# Patient Record
Sex: Female | Born: 1950 | Race: Black or African American | Hispanic: No | Marital: Single | Smoking: Current every day smoker
Health system: Southern US, Community
[De-identification: ages and names within clinical notes are randomized; demographics above are authoritative.]

## PROBLEM LIST (undated history)

## (undated) DIAGNOSIS — I10 Essential (primary) hypertension: Secondary | ICD-10-CM

## (undated) DIAGNOSIS — R609 Edema, unspecified: Secondary | ICD-10-CM

## (undated) DIAGNOSIS — G610 Guillain-Barre syndrome: Secondary | ICD-10-CM

## (undated) HISTORY — PX: ABDOMINAL HYSTERECTOMY: SHX81

## (undated) HISTORY — PX: OTHER PROCEDURE: U1053

---

## 2011-07-28 ENCOUNTER — Encounter (HOSPITAL_COMMUNITY): Payer: Self-pay

## 2011-07-28 ENCOUNTER — Emergency Department
Admit: 2011-07-28 | Discharge: 2011-07-28 | Disposition: A | Discharge status: Home Health | Payer: Self-pay | Attending: Emergency Medicine | Admitting: Emergency Medicine

## 2011-07-28 HISTORY — DX: Essential (primary) hypertension: I10

## 2011-07-28 HISTORY — DX: Guillain-Barre syndrome (CMS-HCC): G61.0

## 2011-07-28 MED ORDER — HYDROCHLOROTHIAZIDE 25 MG OR TABS
25.00 mg | ORAL_TABLET | Freq: Every day | ORAL | Status: AC
Start: 2011-07-28 — End: ?

## 2011-07-28 MED ORDER — HYDROCHLOROTHIAZIDE 25 MG OR TABS
25.0000 mg | ORAL_TABLET | Freq: Every day | ORAL | Status: DC
Start: 2011-07-28 — End: 2011-07-28

## 2011-07-28 MED ORDER — HYDROCHLOROTHIAZIDE 25 MG OR TABS
25.00 mg | ORAL_TABLET | Freq: Once | ORAL | Status: AC
Start: 2011-07-28 — End: 2011-07-28
  Administered 2011-07-28: 25 mg via ORAL
  Filled 2011-07-28: qty 1

## 2011-07-28 NOTE — ED Notes (Signed)
D/c instrucitons given with pt teaching of dx and medications. Verbal understanding. VSS. NAD. No CP or SOB. TED hose applied by pt.

## 2011-07-28 NOTE — ED Attending Note (Signed)
Patient's history, physical, and plan of care reviewed with resident (Dr. 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    Denies fevers, chills, LE redness, calf pain, SOB, or chest pain.      ROS: As per HPI.  All other systems reviewed and negative.    PMHx: HTN (untreated); Remote h/o Guillan-Barre  PSHx: C-section; Tracheostomy s/p reversed; Hysterectomy  SHx: Denies cigarette smoking, EtOH abuse, or illicit drug use (remote meth use)  FAMHx: Denies family history of diabetes, HTN, stroke, heart problems.  MEDS: Zoloft, Ambien  ALL: NKDA    PE  Initial ED Vitals:  T=97.9; BP=163/109; HR=83; RR=16; Sat=100% room air (normal)  GEN: NAD; A&Ox3  HEENT: perrl; eomi  NECK: no JVD; supple  CV: RRR; S1+S2 nl  LUNGS: CTAB  ABD: soft; NT/ND; +BS  EXT: trace pitting edema to legs to ankles; +darkened discoloration around skin of ankles c/w venous stasis; 2+ bilateral, equal distal pulses in all limbs  NEURO: moving all extremities at full strength; no dysmetria; normal gait    Pertinent Test Results:  Bedside ultrasound performed to evaluate for DVT:  Probe: Linear Probe  Indication: lower extremity swelling  Findings:  Patient placed in supine position, with the R-hip externally rotated and flexed.  Patient appropriately draped and the linear high-frequency probe was used.      The R-Femoral vein was visualized at the junction of the R-saphenous vein takeoff.  The vein was examined from 2cm proximal to this point & distal to this point until the common-femoral vein divides into the superficial + deep femoral veins.  Compression ultrasound at this point was negative for DVT (vein fully compressible).      The L-Femoral vein was visualized at the junction of the L-saphenous  vein takeoff.  The vein was examined from 2cm proximal to this point & distal to this point until the common-femoral vein divides into the superficial + deep femoral veins.  Compression ultrasound at this point was negative for DVT (vein fully compressible).      The R-Popliteal Vein was then visualized by placing the probe on the popliteal fossa on the posteromedial aspect of the knee. The distal 2cm of the vein was visualized and scanned until its divisions into the anterior tibial vein / posterior tibial vein / peroneal vein.  Compression ultrasound at this point was negative for DVT (vein fully compressible).      The L-Popliteal Vein was then visualized by placing the probe on the popliteal fossa on the posteromedial aspect of the knee. The distal 2cm of the vein was visualized and scanned until its divisions into the anterior tibial vein / posterior tibial vein / peroneal vein.  Compression ultrasound at this point was negative for DVT (vein fully compressible).        ED Course / Medical Decision Making:  Overall this is a 66M with h/o HTN, prior Guillan-Barre (remote) now with bilateral LE swelling and darkened skin c/w venous stasis.  HTN is also untreated and pt not on meds.  Denies HA, blurred vision, chest pain, SOB (no signs of end-organ deficits).  Concern for DVT is very low given symmetric findings c/w venous stasis and lack of leg pain/tenderness.  Bedside US is negative for  DVT.  Patient given lower extremity compression stockings and discharged from ED with RX for HCTZ.  Will have pt f/u in outpatient clinic.  Return precautions given..    Case d/w Dr. Jacinta Shoe.

## 2011-07-28 NOTE — ED Provider Notes (Signed)
History  Chief Complaint   Patient presents with   . Foot Pain     pt co skin turning dark on lle c some swelling at times, has been using epison salt to help it but it has not working     HPI Comments: 61 yo with remote hx of Guillan barre, untreated HTN here with had bilateral foot swelling x 3-4 days. Also noticed darkening of her skin. Has never happened to her before. Started off as pruritic, still intermittently. Intermittently painful to touch with worst pain 6-7/10. Describes pain as "charlie horse" Lives at [facility]. Denies changes in detergent, sheets, no new lotions. No unusual contacts she has noticed. No one else with skin issues at [facility].  No history of LE edema in past. Do hx of DOE. Sleeps flat- no orthopnea. No PND. NO hx of heart failure. No recent long distance travel. Smokes 1pp/week. No other drugs. Denies ETOH.     The history is provided by the patient. No language interpreter was used.       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 Negative for fever, chills, diaphoresis and unexpected weight change.   Respiratory: Negative for cough, chest tightness, shortness of breath and wheezing.    Cardiovascular: Positive for leg swelling. Negative for chest pain and palpitations.   Gastrointestinal: Negative for nausea, vomiting, abdominal pain, diarrhea, constipation, blood in stool and abdominal distention.   Genitourinary: Negative for dysuria, hematuria, decreased urine volume and difficulty urinating.   Musculoskeletal: Negative for back pain, joint swelling and arthralgias.   Skin: Positive for color change and rash.   Neurological: Positive for numbness. Negative for weakness and headaches.       Physical Exam  BP  163/109  Pulse 83  Temp(Src) 97.9 F (36.6 C)  Resp 16  Wt 58.968 kg (130 lb)  SpO2 100%    Physical Exam   Constitutional: She is oriented to person, place, and time. She appears well-developed and well-nourished. No distress.   HENT:   Head: Normocephalic and atraumatic.   Mouth/Throat: Oropharynx is clear and moist.   Poor dentition   Eyes: Conjunctivae and EOM are normal. Right eye exhibits no discharge. Left eye exhibits no discharge.   Neck: Normal range of motion. Neck supple.   Cardiovascular: Normal rate and regular rhythm.  Exam reveals no gallop and no friction rub.    Murmur heard.  Pulmonary/Chest: Effort normal and breath sounds normal. No respiratory distress. She has no wheezes. She has no rales. She exhibits no tenderness.   Abdominal: Soft. She exhibits no distension. There is no tenderness. There is no guarding.   Musculoskeletal:   +1 bilateral pedal edema R>L. ttp over R meliolus with no calf tenderness or palpable cords. No erythema or open wounds, warmth. Darkening of skin around bilateral ankles c/w chronic venous stasis changes. Onycomychosis bilateral feet. Bedside ultrasound negative for DVT bilaterally.    Neurological: She is alert and oriented to person, place, and time. Coordination normal.   Skin: Skin is warm and dry. Rash noted. She is not diaphoretic.   Scattered macular rash on shins  ED Course/Medical Decision Making Narrative  61 yo untreated htn w LE swelling and skin changes likely 2/2 to chronic venous stasis. No dvt on bedside u/s. No e/o of CHF on exam. No e/o cellulitis  - HCTZ 25mg  for HTN, instructed to see PCP 1-2 weeks will likely need additional agent  - compression stockings  - discharge home        Additional Notes      Grant Fontana, MD  Resident  07/28/11 [phone]

## 2011-07-28 NOTE — ED Notes (Signed)
Pt c/o bilateral lower leg "spots" no edema, + CSM intact.

## 2011-07-28 NOTE — Discharge Instructions (Addendum)
Hypertension    You have been diagnosed with elevated blood pressure.    The medical term for high blood pressure is hypertension. Many people feel anxious or uncomfortable about being at the hospital. If you feel anxious today, this could make your blood pressure appear high, even if your blood pressure is usually normal. Check your blood pressure several more times when you are not feeling stress. Keep a record of these readings and give this information to your regular doctor. He or she will decide whether you have hypertension that requires medical treatment.    If your blood pressure becomes extremely high all of a sudden, you will probably notice symptoms. In fact, very high blood pressure is a medical emergency. Most people with hypertension have blood pressure that is only a little too high. Mild high blood pressure does not cause specific symptoms. Instead, the effects of hypertension develop slowly over time. Untreated hypertension can affect the heart, brain, kidneys, eyes, and blood vessels. Unfortunately, by the time side-effects become noticeable, the body has already been damaged. This is why hypertension is called "the silent killer!"    The physician treating you today decided to start you on a blood pressure medicine. It is VERY IMPORTANT that you follow up with your regular doctor so he or she can follow your progress and adjust the blood pressure medications. It is likely you will need another medication for your blood pressure so please see your primary care doctor for more help.     It is important to follow up with your regular doctor. Check your blood pressure several times in the next 1 to 2 weeks and tell your doctor about the results. It may be helpful to keep a log or a journal where you can write down your blood pressures. Note the time of day and the activity you were doing when the reading was taken.    YOU SHOULD SEEK MEDICAL ATTENTION IMMEDIATELY, EITHER HERE OR AT THE NEAREST  EMERGENCY DEPARTMENT, IF ANY OF THE FOLLOWING OCCURS:   You have a headache.   You have chest pain.   You are short of breath or have trouble breathing.    You feel weak, especially on only one side of the body.   Your symptoms get worse or you have other concerns.      You have also been diagnosed with chronic venous stasis (leaky veins in your legs). We are giving you compression stockings to wear and this should help with the swelling. The skin discoloration may remain.

## 2011-07-31 NOTE — ED Follow-up Note (Signed)
Follow-up type: Callback       Routine ED Patient Call Back    Patient unable to be contacted, no message left  Number provided not in service

## 2013-09-24 ENCOUNTER — Encounter (HOSPITAL_COMMUNITY): Payer: Self-pay | Admitting: Emergency Medicine

## 2013-09-24 ENCOUNTER — Emergency Department (HOSPITAL_COMMUNITY): Payer: Self-pay

## 2013-09-24 ENCOUNTER — Inpatient Hospital Stay (HOSPITAL_COMMUNITY)
Admission: EM | Admit: 2013-09-24 | Discharge: 2013-09-27 | DRG: 917 | Disposition: A | Discharge status: Home / Self Care | Payer: Self-pay | Attending: Internal Medicine | Admitting: Internal Medicine

## 2013-09-24 DIAGNOSIS — R911 Solitary pulmonary nodule: Secondary | ICD-10-CM

## 2013-09-24 DIAGNOSIS — I10 Essential (primary) hypertension: Secondary | ICD-10-CM | POA: Diagnosis present

## 2013-09-24 DIAGNOSIS — I214 Non-ST elevation (NSTEMI) myocardial infarction: Secondary | ICD-10-CM

## 2013-09-24 DIAGNOSIS — R072 Precordial pain: Secondary | ICD-10-CM

## 2013-09-24 DIAGNOSIS — T43591A Poisoning by other antipsychotics and neuroleptics, accidental (unintentional), initial encounter: Secondary | ICD-10-CM | POA: Diagnosis present

## 2013-09-24 DIAGNOSIS — F15122 Other stimulant abuse with intoxication with perceptual disturbance: Secondary | ICD-10-CM

## 2013-09-24 DIAGNOSIS — F17200 Nicotine dependence, unspecified, uncomplicated: Secondary | ICD-10-CM | POA: Diagnosis present

## 2013-09-24 DIAGNOSIS — T43624A Poisoning by amphetamines, undetermined, initial encounter: Principal | ICD-10-CM | POA: Diagnosis present

## 2013-09-24 DIAGNOSIS — F15929 Other stimulant use, unspecified with intoxication, unspecified: Secondary | ICD-10-CM | POA: Diagnosis present

## 2013-09-24 DIAGNOSIS — R011 Cardiac murmur, unspecified: Secondary | ICD-10-CM | POA: Diagnosis present

## 2013-09-24 DIAGNOSIS — Z79899 Other long term (current) drug therapy: Secondary | ICD-10-CM

## 2013-09-24 DIAGNOSIS — F1510 Other stimulant abuse, uncomplicated: Secondary | ICD-10-CM | POA: Diagnosis present

## 2013-09-24 DIAGNOSIS — R079 Chest pain, unspecified: Secondary | ICD-10-CM | POA: Diagnosis present

## 2013-09-24 DIAGNOSIS — Z7982 Long term (current) use of aspirin: Secondary | ICD-10-CM

## 2013-09-24 HISTORY — DX: Guillain-Barre syndrome: G61.0

## 2013-09-24 HISTORY — DX: Essential (primary) hypertension: I10

## 2013-09-24 HISTORY — DX: Edema, unspecified: R60.9

## 2013-09-24 LAB — RAPID URINE DRUG SCREEN, HOSP PERFORMED
Amphetamines: POSITIVE — AB
Barbiturates: NOT DETECTED
Benzodiazepines: NOT DETECTED
Cocaine: NOT DETECTED
Opiates: POSITIVE — AB
Tetrahydrocannabinol: NOT DETECTED

## 2013-09-24 LAB — I-STAT TROPONIN, ED
TROPONIN I, POC: 0.14 ng/mL — AB (ref 0.00–0.08)
Troponin i, poc: 0.06 ng/mL (ref 0.00–0.08)

## 2013-09-24 LAB — CBC WITH DIFFERENTIAL/PLATELET
Basophils Absolute: 0 10*3/uL (ref 0.0–0.1)
Basophils Relative: 0 % (ref 0–1)
Eosinophils Absolute: 0.1 10*3/uL (ref 0.0–0.7)
Eosinophils Relative: 1 % (ref 0–5)
HCT: 43.5 % (ref 36.0–46.0)
Hemoglobin: 14.4 g/dL (ref 12.0–15.0)
Lymphocytes Relative: 19 % (ref 12–46)
Lymphs Abs: 1.4 10*3/uL (ref 0.7–4.0)
MCH: 29 pg (ref 26.0–34.0)
MCHC: 33.1 g/dL (ref 30.0–36.0)
MCV: 87.5 fL (ref 78.0–100.0)
Monocytes Absolute: 0.5 10*3/uL (ref 0.1–1.0)
Monocytes Relative: 6 % (ref 3–12)
Neutro Abs: 5.3 10*3/uL (ref 1.7–7.7)
Neutrophils Relative %: 74 % (ref 43–77)
Platelets: 217 10*3/uL (ref 150–400)
RBC: 4.97 MIL/uL (ref 3.87–5.11)
RDW: 13.5 % (ref 11.5–15.5)
WBC: 7.2 10*3/uL (ref 4.0–10.5)

## 2013-09-24 LAB — BASIC METABOLIC PANEL
Anion gap: 17 — ABNORMAL HIGH (ref 5–15)
BUN: 14 mg/dL (ref 6–23)
CO2: 22 mEq/L (ref 19–32)
Calcium: 9.1 mg/dL (ref 8.4–10.5)
Chloride: 104 mEq/L (ref 96–112)
Creatinine, Ser: 0.78 mg/dL (ref 0.50–1.10)
GFR calc Af Amer: >90 mL/min (ref >90)
GFR calc non Af Amer: 87 mL/min — ABNORMAL LOW (ref >90)
Glucose, Bld: 118 mg/dL — ABNORMAL HIGH (ref 70–99)
Potassium: 3.6 mEq/L — ABNORMAL LOW (ref 3.7–5.3)
Sodium: 143 mEq/L (ref 137–147)

## 2013-09-24 LAB — TROPONIN I: Troponin I: 0.35 ng/mL (ref <0.30)

## 2013-09-24 LAB — D-DIMER, QUANTITATIVE: D-Dimer, Quant: 1.25 ug/mL-FEU — ABNORMAL HIGH (ref 0.00–0.48)

## 2013-09-24 MED ORDER — ASPIRIN 81 MG PO CHEW
324.0000 mg | CHEWABLE_TABLET | Freq: Once | ORAL | Status: AC
  Administered 2013-09-24: 324 mg via ORAL
  Filled 2013-09-24: qty 4

## 2013-09-24 MED ORDER — IOHEXOL 350 MG/ML SOLN
100.0000 mL | Freq: Once | INTRAVENOUS | Status: AC | PRN
  Administered 2013-09-24: 100 mL via INTRAVENOUS

## 2013-09-24 MED ORDER — MORPHINE SULFATE 4 MG/ML IJ SOLN
4.0000 mg | Freq: Once | INTRAMUSCULAR | Status: AC
  Administered 2013-09-24: 4 mg via INTRAVENOUS
  Filled 2013-09-24: qty 1

## 2013-09-24 NOTE — ED Provider Notes (Signed)
CSN: [id number]     Arrival date & time 09/24/13  1657 History   First MD Initiated Contact with Patient 09/24/13 1658     Chief Complaint  Patient presents with  . Headache     (Consider location/radiation/quality/duration/timing/severity/associated sxs/prior Treatment) HPI Comments: Patient with history of Guillain-Barr, and hypertension, and peripheral edema presents emergency department with chief complaint of a headache that started around noon today. She complains of associated facial numbness, no weakness. She denies any weakness in her extremities. She does not have a history of headaches. She denies any fevers, or chills. She states the headache has progressively worsened since onset.  Additionally, she complains of chest pain and shortness of breath. She's been having chest pain for the past couple of days. She denies any cough. She has a history of hypertension, but no known cardiac disease.  She also complains of a bug bites to her lower extremities, as well as some erythema to her shins.  The history is provided by the patient. No language interpreter was used.    No past medical history on file. No past surgical history on file. No family history on file. History  Substance Use Topics  . Smoking status: Not on file  . Smokeless tobacco: Not on file  . Alcohol Use: Not on file   OB History   No data available     Review of Systems  Constitutional: Negative for fever and chills.  Respiratory: Positive for shortness of breath.   Cardiovascular: Positive for chest pain.  Skin: Positive for rash.  Neurological: Positive for numbness and headaches.  All other systems reviewed and are negative.     Allergies  Review of patient's allergies indicates not on file.  Home Medications   Prior to Admission medications   Not on File   There were no vitals taken for this visit. Physical Exam  Nursing note and vitals reviewed. Constitutional: She is oriented to  person, place, and time. She appears well-developed and well-nourished.  HENT:  Head: Normocephalic and atraumatic.  Poor dentition throughout  Eyes: Conjunctivae and EOM are normal. Pupils are equal, round, and reactive to light.  Neck: Normal range of motion. Neck supple.  Cardiovascular: Normal rate and regular rhythm.  Exam reveals no gallop and no friction rub.   No murmur heard. Pulmonary/Chest: Effort normal and breath sounds normal. No respiratory distress. She has no wheezes. She has no rales. She exhibits no tenderness.  Abdominal: Soft. Bowel sounds are normal. She exhibits no distension and no mass. There is no tenderness. There is no rebound and no guarding.  Musculoskeletal: Normal range of motion. She exhibits no edema and no tenderness.  1+ edema in lower extremities, range of motion and strength in upper and lower extremities is 5/5  Neurological: She is alert and oriented to person, place, and time.  CN 3-12 intact, no pronator drift, normal finger to nose, speech is clear, movements are goal oriented  Skin: Skin is warm and dry.  Scattered bug bites on the lower extremities, shins are mildly erythematous, but this does not look like cellulitis, more characteristic of peripheral vascular disease  Psychiatric: She has a normal mood and affect. Her behavior is normal. Judgment and thought content normal.    ED Course  Procedures (including critical care time) Results for orders placed during the hospital encounter of 09/24/13  MRSA PCR SCREENING      Result Value Ref Range   MRSA by PCR NEGATIVE  NEGATIVE  CBC  WITH DIFFERENTIAL      Result Value Ref Range   WBC 7.2  4.0 - 10.5 K/uL   RBC 4.97  3.87 - 5.11 MIL/uL   Hemoglobin 14.4  12.0 - 15.0 g/dL   HCT 65.7  84.6 - 96.2 %   MCV 87.5  78.0 - 100.0 fL   MCH 29.0  26.0 - 34.0 pg   MCHC 33.1  30.0 - 36.0 g/dL   RDW 95.2  84.1 - 32.4 %   Platelets 217  150 - 400 K/uL   Neutrophils Relative % 74  43 - 77 %   Neutro Abs  5.3  1.7 - 7.7 K/uL   Lymphocytes Relative 19  12 - 46 %   Lymphs Abs 1.4  0.7 - 4.0 K/uL   Monocytes Relative 6  3 - 12 %   Monocytes Absolute 0.5  0.1 - 1.0 K/uL   Eosinophils Relative 1  0 - 5 %   Eosinophils Absolute 0.1  0.0 - 0.7 K/uL   Basophils Relative 0  0 - 1 %   Basophils Absolute 0.0  0.0 - 0.1 K/uL  BASIC METABOLIC PANEL      Result Value Ref Range   Sodium 143  137 - 147 mEq/L   Potassium 3.6 (*) 3.7 - 5.3 mEq/L   Chloride 104  96 - 112 mEq/L   CO2 22  19 - 32 mEq/L   Glucose, Bld 118 (*) 70 - 99 mg/dL   BUN 14  6 - 23 mg/dL   Creatinine, Ser 4.01  0.50 - 1.10 mg/dL   Calcium 9.1  8.4 - 02.7 mg/dL   GFR calc non Af Amer 87 (*) >90 mL/min   GFR calc Af Amer >90  >90 mL/min   Anion gap 17 (*) 5 - 15  D-DIMER, QUANTITATIVE      Result Value Ref Range   D-Dimer, Quant 1.25 (*) 0.00 - 0.48 ug/mL-FEU  TROPONIN I      Result Value Ref Range   Troponin I 0.35 (*) <0.30 ng/mL  URINE RAPID DRUG SCREEN (HOSP PERFORMED)      Result Value Ref Range   Opiates POSITIVE (*) NONE DETECTED   Cocaine NONE DETECTED  NONE DETECTED   Benzodiazepines NONE DETECTED  NONE DETECTED   Amphetamines POSITIVE (*) NONE DETECTED   Tetrahydrocannabinol NONE DETECTED  NONE DETECTED   Barbiturates NONE DETECTED  NONE DETECTED  HEPARIN LEVEL (UNFRACTIONATED)      Result Value Ref Range   Heparin Unfractionated <0.10 (*) 0.30 - 0.70 IU/mL  TROPONIN I      Result Value Ref Range   Troponin I <0.30  <0.30 ng/mL  TROPONIN I      Result Value Ref Range   Troponin I 0.33 (*) <0.30 ng/mL  TROPONIN I      Result Value Ref Range   Troponin I <0.30  <0.30 ng/mL  LIPID PANEL      Result Value Ref Range   Cholesterol 139  0 - 200 mg/dL   Triglycerides 66  <253 mg/dL   HDL 70  >66 mg/dL   Total CHOL/HDL Ratio 2.0     VLDL 13  0 - 40 mg/dL   LDL Cholesterol 56  0 - 99 mg/dL  TSH      Result Value Ref Range   TSH 0.656  0.350 - 4.500 uIU/mL  CBC      Result Value Ref Range   WBC 5.1  4.0 -  10.5  K/uL   RBC 4.30  3.87 - 5.11 MIL/uL   Hemoglobin 12.2  12.0 - 15.0 g/dL   HCT 16.1  09.6 - 04.5 %   MCV 87.4  78.0 - 100.0 fL   MCH 28.4  26.0 - 34.0 pg   MCHC 32.4  30.0 - 36.0 g/dL   RDW 40.9  81.1 - 91.4 %   Platelets 223  150 - 400 K/uL  I-STAT TROPOININ, ED      Result Value Ref Range   Troponin i, poc 0.06  0.00 - 0.08 ng/mL   Comment 3           I-STAT TROPOININ, ED      Result Value Ref Range   Troponin i, poc 0.14 (*) 0.00 - 0.08 ng/mL   Comment NOTIFIED PHYSICIAN     Comment 3            Dg Chest 2 View  09/24/2013   CLINICAL DATA:  chest pain  EXAM: CHEST - 2 VIEW  COMPARISON:  None available  FINDINGS: Lungs are clear. Heart size and mediastinal contours are within normal limits. No effusion.  No pneumothorax.  Atheromatous aorta. Visualized skeletal structures are unremarkable.  IMPRESSION: No acute cardiopulmonary disease.   Electronically Signed   By: Oley Balm M.D.   On: 09/24/2013 19:05   Ct Head Wo Contrast  09/24/2013   CLINICAL DATA:  Headache and facial numbness.  EXAM: CT HEAD WITHOUT CONTRAST  TECHNIQUE: Contiguous axial images were obtained from the base of the skull through the vertex without intravenous contrast.  COMPARISON:  None.  FINDINGS: The ventricles are normal in size and configuration. No extra-axial fluid collections are identified. The gray-white differentiation is normal. No CT findings for acute intracranial process such as hemorrhage or infarction. No mass lesions. The brainstem and cerebellum are grossly normal.  The bony structures are intact. The paranasal sinuses and mastoid air cells are clear. The globes are intact.  IMPRESSION: No acute intracranial findings or mass lesion.   Electronically Signed   By: Loralie Champagne M.D.   On: 09/24/2013 18:53   Ct Angio Chest Pe W/cm &/or Wo Cm  09/25/2013   EXAM: CT ANGIOGRAPHY CHEST WITH CONTRAST  TECHNIQUE: Multidetector CT imaging of the chest was performed using the standard protocol during  bolus administration of intravenous contrast. Multiplanar CT image reconstructions and MIPs were obtained to evaluate the vascular anatomy.  CONTRAST:  OMNIPAQUE IOHEXOL 350 MG/ML SOLN  COMPARISON:  Prior radiograph performed earlier on the same day.  FINDINGS: Thyroid gland is within normal limits.  No pathologically enlarged mediastinal, hilar, or axillary lymph nodes are identified.  Intrathoracic aorta is of normal caliber and appearance. Mild calcified atheromatous disease seen within the aortic arch. Great vessels within normal limits.  Heart size is normal.  No pericardial effusion.  The pulmonary arterial tree is well opacified. No focal filling defect to suggest acute pulmonary embolism. Re-formatted imaging confirms these findings. The main pulmonary artery is of normal caliber measuring 2.1 cm in maximal diameter.  No pulmonary edema or pleural effusion. Subsegmental atelectasis seen dependently within the bilateral lung bases. A more focal 9 mm nodular opacity seen within the peripheral right lung base (series 6, image 63). No other pulmonary nodules or masses.  Visualized portions of the upper abdomen are within normal limits.  No acute osseous abnormality. No worrisome lytic or blastic osseous lesions.  Review of the MIP images confirms the above findings.  IMPRESSION: 1.  No CT evidence of acute pulmonary embolism. 2. No other acute cardiopulmonary abnormality identified. 3. Bibasilar atelectasis. 4. 8 mm irregular nodular opacity within the right lung base, indeterminate. If the patient is at high risk for bronchogenic carcinoma, follow-up chest CT at 3-846months is recommended. If the patient is at low risk for bronchogenic carcinoma, follow-up chest CT at 6-12 months is recommended. This recommendation follows the consensus statement: Guidelines for Management of Small Pulmonary Nodules Detected on CT Scans: A Statement from the Fleischner Society as published in Radiology 2005; 237:395-400.    Electronically Signed   By: Rise MuBenjamin  McClintock M.D.   On: 09/25/2013 01:15     Imaging Review No results found.   EKG Interpretation   Date/Time:  Friday September 24 2013 23:25:16 EDT Ventricular Rate:  79 PR Interval:  157 QRS Duration: 75 QT Interval:  398 QTC Calculation: 456 R Axis:   28 Text Interpretation:  Sinus rhythm Minimal ST depression, lateral leads  Confirmed by Denton LankSTEINL  MD, Caryn BeeKEVIN ([id number]) on 09/24/2013 11:33:07 PM      MDM   Final diagnoses:  NSTEMI (non-ST elevated myocardial infarction)    Patient with headache that started today around noon.  CT is negative.  As this falls within the 6 hour window, I have very low [facility].  Patient's headache is improved in the ED.  She moves all her extremities and has a normal neuro exam.  Doubt stroke or TIA.  She does have some erythema around her shins, which I suspect is peripheral vascular disease, but cellulitis is not ruled out.  Patient also reports CP and SOB.  [place]  She traveled by bus.  I will order a d-dimer along with a delta troponin.  If these tests are negative, I suspect that the patient can go home with PCP follow-up.   8:59 PM Delta troponin (i-stat) increased to 0.14.  Discussed this with Dr. Denton LankSteinl, will order lab troponin.  Lab troponin is .35.  CT chest is still pending to evaluate for PE.  Discussed with Dr. Denton LankSteinl, plan is admission after CT.  Patient signed out to Dr. Wilkie AyeHorton, who will follow up on CT and arrange for admission accordingly.  Roxy Horsemanobert Jaylenn Altier, PA-C 09/25/13 [phone]

## 2013-09-24 NOTE — ED Notes (Signed)
Pt. Reports having a headache since noon, face is numb.  Pt. Also reports having chest pain, Pt. Also reports that her palms and bottom of her are red. Pt. Also has bites on  Both of her legs. Bilateral legs are red up to her mid-shins.  Rt. Leg is redness is greater that the lt. Leg.  Rt. Leg is warm to touch.  Bilateral edema +1 to both feet.  Pt  Is alert and oriented X3.  Skin is warm and dry.  Pt is in NAD

## 2013-09-25 ENCOUNTER — Encounter (HOSPITAL_COMMUNITY): Payer: Self-pay | Admitting: Internal Medicine

## 2013-09-25 DIAGNOSIS — R011 Cardiac murmur, unspecified: Secondary | ICD-10-CM

## 2013-09-25 DIAGNOSIS — F1510 Other stimulant abuse, uncomplicated: Secondary | ICD-10-CM

## 2013-09-25 DIAGNOSIS — I517 Cardiomegaly: Secondary | ICD-10-CM

## 2013-09-25 DIAGNOSIS — F15929 Other stimulant use, unspecified with intoxication, unspecified: Secondary | ICD-10-CM | POA: Diagnosis present

## 2013-09-25 DIAGNOSIS — I214 Non-ST elevation (NSTEMI) myocardial infarction: Secondary | ICD-10-CM

## 2013-09-25 DIAGNOSIS — F19988 Other psychoactive substance use, unspecified with other psychoactive substance-induced disorder: Secondary | ICD-10-CM

## 2013-09-25 LAB — LIPID PANEL
Cholesterol: 139 mg/dL (ref 0–200)
HDL: 70 mg/dL (ref >39)
LDL Cholesterol: 56 mg/dL (ref 0–99)
Total CHOL/HDL Ratio: 2 RATIO
Triglycerides: 66 mg/dL (ref <150)
VLDL: 13 mg/dL (ref 0–40)

## 2013-09-25 LAB — CBC
HCT: 37.6 % (ref 36.0–46.0)
Hemoglobin: 12.2 g/dL (ref 12.0–15.0)
MCH: 28.4 pg (ref 26.0–34.0)
MCHC: 32.4 g/dL (ref 30.0–36.0)
MCV: 87.4 fL (ref 78.0–100.0)
PLATELETS: 223 10*3/uL (ref 150–400)
RBC: 4.3 MIL/uL (ref 3.87–5.11)
RDW: 13.6 % (ref 11.5–15.5)
WBC: 5.1 10*3/uL (ref 4.0–10.5)

## 2013-09-25 LAB — HEMOGLOBIN A1C
HEMOGLOBIN A1C: 5.6 % (ref <5.7)
Mean Plasma Glucose: 114 mg/dL (ref <117)

## 2013-09-25 LAB — TSH: TSH: 0.656 u[IU]/mL (ref 0.350–4.500)

## 2013-09-25 LAB — HEPARIN LEVEL (UNFRACTIONATED)
HEPARIN UNFRACTIONATED: 0.34 [IU]/mL (ref 0.30–0.70)
Heparin Unfractionated: <0.1 IU/mL — ABNORMAL LOW (ref 0.30–0.70)

## 2013-09-25 LAB — MRSA PCR SCREENING: MRSA by PCR: NEGATIVE

## 2013-09-25 LAB — TROPONIN I
TROPONIN I: 0.33 ng/mL — AB (ref <0.30)
Troponin I: <0.3 ng/mL (ref <0.30)

## 2013-09-25 MED ORDER — ASPIRIN EC 325 MG PO TBEC
325.0000 mg | DELAYED_RELEASE_TABLET | Freq: Every day | ORAL | Status: DC
  Filled 2013-09-25: qty 1

## 2013-09-25 MED ORDER — HEPARIN (PORCINE) IN NACL 100-0.45 UNIT/ML-% IJ SOLN
1200.0000 [IU]/h | INTRAMUSCULAR | Status: DC
  Administered 2013-09-25 (×2): 1100 [IU]/h via INTRAVENOUS
  Administered 2013-09-26 (×2): 1200 [IU]/h via INTRAVENOUS
  Filled 2013-09-25 (×5): qty 250

## 2013-09-25 MED ORDER — ATORVASTATIN CALCIUM 80 MG PO TABS
80.0000 mg | ORAL_TABLET | Freq: Every day | ORAL | Status: DC
  Administered 2013-09-25 – 2013-09-26 (×2): 80 mg via ORAL
  Filled 2013-09-25 (×3): qty 1

## 2013-09-25 MED ORDER — METOPROLOL TARTRATE 12.5 MG HALF TABLET
12.5000 mg | ORAL_TABLET | Freq: Two times a day (BID) | ORAL | Status: DC
  Administered 2013-09-25 – 2013-09-27 (×5): 12.5 mg via ORAL
  Filled 2013-09-25 (×6): qty 1

## 2013-09-25 MED ORDER — ASPIRIN 81 MG PO CHEW
81.0000 mg | CHEWABLE_TABLET | Freq: Every day | ORAL | Status: DC
  Administered 2013-09-25 – 2013-09-27 (×3): 81 mg via ORAL
  Filled 2013-09-25 (×3): qty 1

## 2013-09-25 MED ORDER — HEPARIN BOLUS VIA INFUSION
4000.0000 [IU] | Freq: Once | INTRAVENOUS | Status: AC
  Administered 2013-09-25: 4000 [IU] via INTRAVENOUS
  Filled 2013-09-25: qty 4000

## 2013-09-25 MED ORDER — HEPARIN (PORCINE) IN NACL 100-0.45 UNIT/ML-% IJ SOLN
900.0000 [IU]/h | INTRAMUSCULAR | Status: DC
  Administered 2013-09-25: 900 [IU]/h via INTRAVENOUS
  Filled 2013-09-25 (×2): qty 250

## 2013-09-25 MED ORDER — ACETAMINOPHEN 325 MG PO TABS: 650.0000 mg | ORAL_TABLET | ORAL | Status: DC | PRN

## 2013-09-25 MED ORDER — MORPHINE SULFATE 2 MG/ML IJ SOLN: 2.0000 mg | INTRAMUSCULAR | Status: DC | PRN

## 2013-09-25 MED ORDER — ONDANSETRON HCL 4 MG/2ML IJ SOLN: 4.0000 mg | Freq: Four times a day (QID) | INTRAMUSCULAR | Status: DC | PRN

## 2013-09-25 MED ORDER — HEPARIN BOLUS VIA INFUSION
2000.0000 [IU] | Freq: Once | INTRAVENOUS | Status: AC
  Administered 2013-09-25: 2000 [IU] via INTRAVENOUS
  Filled 2013-09-25: qty 2000

## 2013-09-25 MED ORDER — SODIUM CHLORIDE 0.9 % IV SOLN
INTRAVENOUS | Status: DC
  Administered 2013-09-25: 05:00:00 via INTRAVENOUS
  Administered 2013-09-26: 10 mL via INTRAVENOUS

## 2013-09-25 MED ORDER — SODIUM CHLORIDE 0.9 % IJ SOLN: 3.0000 mL | INTRAMUSCULAR | Status: DC | PRN

## 2013-09-25 MED ORDER — SODIUM CHLORIDE 0.9 % IJ SOLN
3.0000 mL | Freq: Two times a day (BID) | INTRAMUSCULAR | Status: DC
  Administered 2013-09-25 – 2013-09-26 (×3): 3 mL via INTRAVENOUS

## 2013-09-25 NOTE — Progress Notes (Signed)
CRITICAL VALUE ALERT  Critical value received:  0940  Date of notification:  07/[id number]  Time of notification: 0942  Critical value read back:yes   Nurse who received alert:  Marge K. Meryl CrutchLessard, RN   MD notified (1st page):  Dr. Marlin CanaryJessica Vann     Time of first page:  [phone]  MD notified (2nd page):  Time of second page:  Responding MD:  Dr. Marlin CanaryJessica Vann  Time MD responded:  [phone]

## 2013-09-25 NOTE — Progress Notes (Addendum)
ANTICOAGULATION CONSULT NOTE - Follow Up Consult  Pharmacy Consult for heparin Indication: chest pain/ACS  No Known Allergies  Patient Measurements: Height: 5\' 5"  (165.1 cm) Weight: 172 lb 13.5 oz (78.4 kg) IBW/kg (Calculated) : 57  Vital Signs: Temp: 98 F (36.7 C) (07/25 1200) Temp src: Oral (07/25 1200) BP: 128/59 mmHg (07/25 1200) Pulse Rate: 73 (07/25 0715)  Labs:  Recent Labs  09/24/13 1732  09/25/13 0545 09/25/13 0810 09/25/13 1225  HGB 14.4  --   --   --  12.2  HCT 43.5  --   --   --  37.6  PLT 217  --   --   --  223  HEPARINUNFRC  --   --   --   --  <0.10*  CREATININE 0.78  --   --   --   --   TROPONINI  --   < > <0.30 0.33* <0.30  < > = values in this interval not displayed.  Estimated Creatinine Clearance: 74.5 ml/min (by C-G formula based on Cr of 0.78).   Medications:  Scheduled:  . aspirin  81 mg Oral Daily  . atorvastatin  80 mg Oral q1800  . metoprolol tartrate  12.5 mg Oral BID  . sodium chloride  3 mL Intravenous Q12H   Infusions:  . sodium chloride 10 mL/hr at 09/25/13 0513  . heparin 900 Units/hr (09/25/13 0513)    Assessment: 63 yo female with NSTEMI on heparin at 900 units/hr and the initial heparin level is undetectable. Patient noted for cath on Monday. RN has noted episodes of Alaris pump alarms and possible IV occlusion.  Goal of Therapy:  Heparin level 0.3-0.7 units/ml Monitor platelets by anticoagulation protocol: Yes   Plan:  -Heparin bolus 2000 units IV then increase to 1100 units/hr  -Heparin level in 6 hours and daily wth CBC daily  Harland GermanAndrew Vara Mairena, Pharm D 09/25/2013 1:26 PM

## 2013-09-25 NOTE — H&P (Signed)
[facility] History and Physical  Tracie BeamCathlean Langhorst WUJ:[id number] DOB: [birth year] DOA: 09/24/2013  Referring physician: EDP PCP: No PCP Per Patient   Chief Complaint: Chest pain, numbness/tingling of face.   HPI: Tracie Torres is a 63 y.o. female h/o HTN, remote h/o heroin abuse, h/o GBS.  Presents to the ED with an episode of chest pain earlier in the afternoon.  Patient reports numbness, tingling, involving face, hands, feet this afternoon after which she experiened a shooting pain in her left chest with radiation to her back.  Pain was 8/10 and occurred while she was washing dishes.  Symptoms continued and she came to the ED as a result.  Pain resolved with morphine.  Total duration of chest pain was about 20 mins she states.  Did have SOB and lightheadedness with this.  Leading up to yesterday she was asymptomatic.  [place]  In the ED POC trop was 0.14, repeat lab troponin was 0.35.  D-Dimer was elevated CTA chest was performed and negative.  UDS positive for amphetamines.  When discussed with patient she claims no knowledge of the amphetamines, but does admit to buying "4 hand rolled cigarettes from someone because the store was closed on Thursday."  Review of Systems: Systems reviewed.  As above, otherwise negative  Past Medical History  Diagnosis Date  . Guillain Barr syndrome   . Hypertension   . Edema    Past Surgical History  Procedure Laterality Date  . Abdominal hysterectomy    . Cesarean section     Social History:  reports that she has been smoking Cigarettes.  She has been smoking about 0.25 packs per day. She has never used smokeless tobacco. She reports that she drinks about .6 ounces of alcohol per week. She reports that she does not use illicit drugs.  No Known Allergies  Family History  Problem Relation Age of Onset  . Other Mother     hit by car  . Other Brother     gun shot     Prior to Admission  medications   Medication Sig Start Date End Date Taking? Authorizing Provider  aspirin EC 81 MG tablet Take 81 mg by mouth daily.   Yes Historical Provider, MD  furosemide (LASIX) 20 MG tablet Take 20 mg by mouth daily.   Yes Historical Provider, MD  potassium chloride (K-DUR) 10 MEQ tablet Take 10 mEq by mouth daily.   Yes Historical Provider, MD  triamterene-hydrochlorothiazide (MAXZIDE-25) 37.5-25 MG per tablet Take 1 tablet by mouth daily.   Yes Historical Provider, MD   Physical Exam: Filed Vitals:   09/25/13 0500  BP: 155/85  Pulse: 78  Temp: 98.7 F (37.1 C)  Resp: 22    BP 155/85  Pulse 78  Temp(Src) 98.7 F (37.1 C) (Oral)  Resp 22  Ht 5' 5.5" (1.664 m)  Wt 72.576 kg (160 lb)  BMI 26.21 kg/m2  SpO2 99%  General Appearance:    Alert, oriented, no distress, appears stated age  Head:    Normocephalic, atraumatic  Eyes:    PERRL, EOMI, sclera non-icteric        Nose:   Nares without drainage or epistaxis. Mucosa, turbinates normal  Throat:   Moist mucous membranes. Oropharynx without erythema or exudate.  Neck:   Supple. No carotid bruits.  No thyromegaly.  No lymphadenopathy.   Back:     No CVA tenderness, no spinal tenderness  Lungs:     Clear to  auscultation bilaterally, without wheezes, rhonchi or rales  Chest wall:    No tenderness to palpitation  Heart:    Regular rate and rhythm without, gallops, rubs, murmur is present  Abdomen:     Soft, non-tender, nondistended, normal bowel sounds, no organomegaly  Genitalia:    deferred  Rectal:    deferred  Extremities:   No clubbing, cyanosis or edema.  Pulses:   2+ and symmetric all extremities  Skin:   Skin color, texture, turgor normal, no rashes or lesions  Lymph nodes:   Cervical, supraclavicular, and axillary nodes normal  Neurologic:   CNII-XII intact. Normal strength, sensation and reflexes      throughout    Labs on Admission:  Basic Metabolic Panel:  Recent Labs Lab 09/24/13 1732  NA 143  K 3.6*   CL 104  CO2 22  GLUCOSE 118*  BUN 14  CREATININE 0.78  CALCIUM 9.1   Liver Function Tests: No results found for this basename: AST, ALT, ALKPHOS, BILITOT, PROT, ALBUMIN,  in the last 168 hours No results found for this basename: LIPASE, AMYLASE,  in the last 168 hours No results found for this basename: AMMONIA,  in the last 168 hours CBC:  Recent Labs Lab 09/24/13 1732  WBC 7.2  NEUTROABS 5.3  HGB 14.4  HCT 43.5  MCV 87.5  PLT 217   Cardiac Enzymes:  Recent Labs Lab 09/24/13 2059  TROPONINI 0.35*    BNP (last 3 results) No results found for this basename: PROBNP,  in the last 8760 hours CBG: No results found for this basename: GLUCAP,  in the last 168 hours  Radiological Exams on Admission: Dg Chest 2 View  09/24/2013   CLINICAL DATA:  chest pain  EXAM: CHEST - 2 VIEW  COMPARISON:  None available  FINDINGS: Lungs are clear. Heart size and mediastinal contours are within normal limits. No effusion.  No pneumothorax.  Atheromatous aorta. Visualized skeletal structures are unremarkable.  IMPRESSION: No acute cardiopulmonary disease.   Electronically Signed   By: Oley Balm M.D.   On: 09/24/2013 19:05   Ct Head Wo Contrast  09/24/2013   CLINICAL DATA:  Headache and facial numbness.  EXAM: CT HEAD WITHOUT CONTRAST  TECHNIQUE: Contiguous axial images were obtained from the base of the skull through the vertex without intravenous contrast.  COMPARISON:  None.  FINDINGS: The ventricles are normal in size and configuration. No extra-axial fluid collections are identified. The gray-white differentiation is normal. No CT findings for acute intracranial process such as hemorrhage or infarction. No mass lesions. The brainstem and cerebellum are grossly normal.  The bony structures are intact. The paranasal sinuses and mastoid air cells are clear. The globes are intact.  IMPRESSION: No acute intracranial findings or mass lesion.   Electronically Signed   By: Loralie Champagne M.D.    On: 09/24/2013 18:53   Ct Angio Chest Pe W/cm &/or Wo Cm  09/25/2013   EXAM: CT ANGIOGRAPHY CHEST WITH CONTRAST  TECHNIQUE: Multidetector CT imaging of the chest was performed using the standard protocol during bolus administration of intravenous contrast. Multiplanar CT image reconstructions and MIPs were obtained to evaluate the vascular anatomy.  CONTRAST:  OMNIPAQUE IOHEXOL 350 MG/ML SOLN  COMPARISON:  Prior radiograph performed earlier on the same day.  FINDINGS: Thyroid gland is within normal limits.  No pathologically enlarged mediastinal, hilar, or axillary lymph nodes are identified.  Intrathoracic aorta is of normal caliber and appearance. Mild calcified atheromatous disease seen within  the aortic arch. Great vessels within normal limits.  Heart size is normal.  No pericardial effusion.  The pulmonary arterial tree is well opacified. No focal filling defect to suggest acute pulmonary embolism. Re-formatted imaging confirms these findings. The main pulmonary artery is of normal caliber measuring 2.1 cm in maximal diameter.  No pulmonary edema or pleural effusion. Subsegmental atelectasis seen dependently within the bilateral lung bases. A more focal 9 mm nodular opacity seen within the peripheral right lung base (series 6, image 63). No other pulmonary nodules or masses.  Visualized portions of the upper abdomen are within normal limits.  No acute osseous abnormality. No worrisome lytic or blastic osseous lesions.  Review of the MIP images confirms the above findings.  IMPRESSION: 1. No CT evidence of acute pulmonary embolism. 2. No other acute cardiopulmonary abnormality identified. 3. Bibasilar atelectasis. 4. 8 mm irregular nodular opacity within the right lung base, indeterminate. If the patient is at high risk for bronchogenic carcinoma, follow-up chest CT at 3-45months is recommended. If the patient is at low risk for bronchogenic carcinoma, follow-up chest CT at 6-12 months is recommended.  This recommendation follows the consensus statement: Guidelines for Management of Small Pulmonary Nodules Detected on CT Scans: A Statement from the Fleischner Society as published in Radiology 2005; 237:395-400.   Electronically Signed   By: Rise Mu M.D.   On: 09/25/2013 01:15    EKG: Independently reviewed.  Assessment/Plan Principal Problem:   NSTEMI (non-ST elevated myocardial infarction) Active Problems:   Heart murmur   Amphetamine and psychostimulant intoxication with complication   1. NSTEMI - demand ischemia due to amphetamine use seems the most likely (wether intentional ingestion of amphetamines or accidental poisoning, UDS is positive for amphetamines) 1. Trend troponin 2. Cardiology has been consulted, please see their note also 3. ASA 81 daily per cards recs 4. Heparin gtt per cards recs 5. A1C, lipid panel, TSH 6. Atorvastatin 80mg  daily and metoprolol 12.5 mg BID. 7. Tele monitor 8. TTE in AM, also to evaluate heart murmur. 2. Amphetamine ingestion - Did let patient know about our UDS findings, she maintains accidental ingestion and not intentional use.  Counseled patient on the effects of amphetamine as well as tobacco smoking cessation.    Code Status: Full Code  Family Communication: No family present Disposition Plan: Admit to inpatient   Time spent: 70 min  GARDNER, JARED M. [facility] Pager [phone]  If 7AM-7PM, please contact the day team taking care of the patient [email or web address] Password [email or web address] 09/25/2013, 5:11 AM

## 2013-09-25 NOTE — Progress Notes (Signed)
Pharmacy: re-Heparin  Patient is a 63 y.o F on heparin for CP with plan for possible cardiac cath on Monday.  Heparin level is therapeutic at 0.34 with rate at 1100 units/hr.  No bleeding documented.  Plan: 1) continue rate at 1100 units/hr 2) f/u with AM labs  Dorna LeitzAnh Othella Slappey, PharmD, BCPS

## 2013-09-25 NOTE — Consult Note (Signed)
Cardiology Consultation Note  Patient ID: Tracie Torres, MRN: [id number], DOB/AGE: 63/15/1952 63 y.o. Admit date: 09/24/2013   Date of Consult: 09/25/2013 Primary Physician: No PCP Per Patient Primary Cardiologist: None.  Chief Complaint: Numbness/tingling of the face and limbs, as well as chest pain Reason for Consult: Chest pain with troponin elevation  HPI: This is a 63 year old woman with history of hypertension, remote substance abuse, and Guillain-Barre syndrome, whom we have been asked to evaluate due to an episode of chest pain earlier in the day with subsequent troponin elevation.  The patient reports that she developed numbness and tingling involving her face, lips, both hands, and both feet earlier this afternoon.  Shortly thereafter she also experienced a shooting pain in her left chest radiating to the back near her left shoulder blade with a maximal intensity of 8/10.  At that time, she was washing dishes.  The symptoms continued and ultimately prompted her to come to the emergency department for further evaluation.  Initially, the pain was still present here though it ultimately resolved with morphine.  Her description of the events suggest that the symptoms lasted for several hours; however, she states that her chest pain was really only present for about 20 minutes.  She had associated shortness of breath and lightheadedness but denies nausea, vomiting, and diaphoresis.  Leading up to yesterday, she has not had any other symptoms at rest or with exertion, including chest pain, shortness of breath, and palpitations.  The patient endorses a heart murmur that she has had since childhood, but does not recall any further specifics.  She otherwise denies a history of cardiac disease.  The patient notes that the numbness/tingling of her feet predominantly involves the balls of her feet and may have actually been present for over a month.  She notes that some of her numbness symptoms are  similar to what she experienced in the remote past with Guillain-Barre.  She is also experienced mild ankle edema for the last week, though her weight has been unchanged per her report.  She denies orthopnea and PND.  In the ED, the patient was initially found to have a mildly elevated point-of-care troponin at 0.14.  Repeat lab troponin at that time was 0.35.  The patient also had an elevated d-dimer for which a CTA of the chest was performed; no pulmonary embolism was identified.  She has received aspirin 324 mg and morphine 4 mg IV x1.  Past Medical History  Diagnosis Date  . Guillain Barr syndrome   . Hypertension   . Edema       Most Recent Cardiac Studies: None.   Surgical History:  Past Surgical History  Procedure Laterality Date  . Abdominal hysterectomy    . Cesarean section       Home Meds: Prior to Admission medications   Medication Sig Start Date Trayveon Beckford Date Taking? Authorizing Provider  aspirin EC 81 MG tablet Take 81 mg by mouth daily.   Yes Historical Provider, MD  furosemide (LASIX) 20 MG tablet Take 20 mg by mouth daily.   Yes Historical Provider, MD  potassium chloride (K-DUR) 10 MEQ tablet Take 10 mEq by mouth daily.   Yes Historical Provider, MD  triamterene-hydrochlorothiazide (MAXZIDE-25) 37.5-25 MG per tablet Take 1 tablet by mouth daily.   Yes Historical Provider, MD    Inpatient Medications:     Allergies: No Known Allergies  History   Social History  . Marital Status: Single    Spouse Name: N/A  Number of Children: N/A  . Years of Education: N/A   Occupational History  . Not on file.   Social History Main Topics  . Smoking status: Current Every Day Smoker -- 0.25 packs/day    Types: Cigarettes  . Smokeless tobacco: Never Used  . Alcohol Use: 0.6 oz/week    1 Glasses of wine per week  . Drug Use: No     Comment: recovering drug user since 1997  . Sexual Activity: Not on file   Other Topics Concern  . Not on file   Social History  Narrative  . No narrative on file     Family History  Problem Relation Age of Onset  . Other Mother     hit by car  . Other Brother     gun shot     Review of Systems: A 12-system review of systems was performed and was negative except as noted in the history of present illness.  Labs:  Recent Labs  09/24/13 2059  TROPONINI 0.35*   Lab Results  Component Value Date   WBC 7.2 09/24/2013   HGB 14.4 09/24/2013   HCT 43.5 09/24/2013   MCV 87.5 09/24/2013   PLT 217 09/24/2013    Recent Labs Lab 09/24/13 1732  NA 143  K 3.6*  CL 104  CO2 22  BUN 14  CREATININE 0.78  CALCIUM 9.1  GLUCOSE 118*   Lab Results  Component Value Date   DDIMER 1.25* 09/24/2013    Radiology/Studies:  Dg Chest 2 View  09/24/2013   CLINICAL DATA:  chest pain  EXAM: CHEST - 2 VIEW  COMPARISON:  None available  FINDINGS: Lungs are clear. Heart size and mediastinal contours are within normal limits. No effusion.  No pneumothorax.  Atheromatous aorta. Visualized skeletal structures are unremarkable.  IMPRESSION: No acute cardiopulmonary disease.   Electronically Signed   By: Oley Balmaniel  Hassell M.D.   On: 09/24/2013 19:05   Ct Head Wo Contrast  09/24/2013   CLINICAL DATA:  Headache and facial numbness.  EXAM: CT HEAD WITHOUT CONTRAST  TECHNIQUE: Contiguous axial images were obtained from the base of the skull through the vertex without intravenous contrast.  COMPARISON:  None.  FINDINGS: The ventricles are normal in size and configuration. No extra-axial fluid collections are identified. The gray-white differentiation is normal. No CT findings for acute intracranial process such as hemorrhage or infarction. No mass lesions. The brainstem and cerebellum are grossly normal.  The bony structures are intact. The paranasal sinuses and mastoid air cells are clear. The globes are intact.  IMPRESSION: No acute intracranial findings or mass lesion.   Electronically Signed   By: Loralie ChampagneMark  Gallerani M.D.   On: 09/24/2013 18:53    Ct Angio Chest Pe W/cm &/or Wo Cm  09/25/2013   EXAM: CT ANGIOGRAPHY CHEST WITH CONTRAST  TECHNIQUE: Multidetector CT imaging of the chest was performed using the standard protocol during bolus administration of intravenous contrast. Multiplanar CT image reconstructions and MIPs were obtained to evaluate the vascular anatomy.  CONTRAST:  100mL OMNIPAQUE IOHEXOL 350 MG/ML SOLN  COMPARISON:  Prior radiograph performed earlier on the same day.  FINDINGS: Thyroid gland is within normal limits.  No pathologically enlarged mediastinal, hilar, or axillary lymph nodes are identified.  Intrathoracic aorta is of normal caliber and appearance. Mild calcified atheromatous disease seen within the aortic arch. Great vessels within normal limits.  Heart size is normal.  No pericardial effusion.  The pulmonary arterial tree is well opacified. No  focal filling defect to suggest acute pulmonary embolism. Re-formatted imaging confirms these findings. The main pulmonary artery is of normal caliber measuring 2.1 cm in maximal diameter.  No pulmonary edema or pleural effusion. Subsegmental atelectasis seen dependently within the bilateral lung bases. A more focal 9 mm nodular opacity seen within the peripheral right lung base (series 6, image 63). No other pulmonary nodules or masses.  Visualized portions of the upper abdomen are within normal limits.  No acute osseous abnormality. No worrisome lytic or blastic osseous lesions.  Review of the MIP images confirms the above findings.  IMPRESSION: 1. No CT evidence of acute pulmonary embolism. 2. No other acute cardiopulmonary abnormality identified. 3. Bibasilar atelectasis. 4. 8 mm irregular nodular opacity within the right lung base, indeterminate. If the patient is at high risk for bronchogenic carcinoma, follow-up chest CT at 3-31months is recommended. If the patient is at low risk for bronchogenic carcinoma, follow-up chest CT at 6-12 months is recommended. This recommendation  follows the consensus statement: Guidelines for Management of Small Pulmonary Nodules Detected on CT Scans: A Statement from the Fleischner Society as published in Radiology 2005; 237:395-400.   Electronically Signed   By: Rise Mu M.D.   On: 09/25/2013 01:15   EKG: Normal sinus rhythm with nonspecific ST segment changes.  Physical Exam: Blood pressure 145/63, pulse 78, temperature 98.2 F (36.8 C), temperature source Oral, resp. rate 15, height 5' 5.5" (1.664 m), weight 72.576 kg (160 lb), SpO2 96.00%. General: Well developed, well nourished, in no acute distress.  She is somnolent but arouses to voice. Head: Normocephalic, atraumatic, sclera non-icteric, no xanthomas, nares are without discharge.  Neck: Negative for carotid bruits. JVD not elevated. Lungs: Clear bilaterally to auscultation without wheezes, rales, or rhonchi. Breathing is unlabored. Heart: RRR with S1 S2.  3/6 crescendo decrescendo murmur loudest at the right upper sternal border but radiating across the precordium into the carotids.  No rubs or gallops. Abdomen: Soft, non-tender, non-distended with normoactive bowel sounds. No hepatomegaly. No rebound/guarding. No obvious abdominal masses. Msk:  Strength and tone appear normal for age. Extremities: No clubbing or cyanosis. No edema.  Distal pedal pulses are 2+ and equal bilaterally. Neuro: Alert and oriented X 3. No facial asymmetry. No focal deficit. Moves all extremities spontaneously.  Cranial nerves III - XII intact Psych:  Responds to questions appropriately with a normal affect.     Assessment and Plan:  63 year old woman with history of heme RA syndrome, substance abuse, and hypertension, who we have been asked to see do to chest pain and troponin elevation. At this time, she is chest pain-free though she continues to have some paresthesias involving her face and extremities.  Her EKG shows nonspecific ST segment changes.  It should be noted that her drug  screen was positive for opiates and amphetamines, the former of which may have been due to the morphine that she received earlier in the evening.  Of note, the patient denies any recent drug use.  At this time, we recommend medical management for NSTEMI, though the patient's chest pain is somewhat atypical.  NSTEMI: No active symptoms her dynamic EKG changes.  Mild troponin elevation is nonspecific but could also be related to amphetamine use if this does not represent a false positive result.  No evidence of pulmonary embolism by CT A.  Though not specifically timed to evaluate the aorta, there is no gross evidence of aortic dissection either.  - Aspirin 81 mg daily  - Start  atorvastatin 80 mg daily and low-dose beta blocker (e.g. Metoprolol tartrate 12.5 mg bid, to be titrated up as blood pressure and heart rate allow).  - Initiate heparin infusion, ACS nomogram  - Trend troponins until they have peaked, then stop  - Risk stratify further with TSH, hemoglobin A1c, and lipid panel  - Obtain transthoracic echocardiogram in the morning  - Tobacco cessation counseling  - Admit to hospitalist service with telemetry monitoring  Heart murmur:  Reportedly not a new finding, per the patient.  I suspect most likely reflects aortic valve involvement, possibly congenitally malformed if it has been present since childhood.  - Obtain transthoracic echocardiogram  Paresthesias:  Unclear etiology with no focal exam findings.  No evidence of large CVA or intracranial hemorrhage by CT.  Patient states that these symptoms are similar to what she experienced with Guillain-Barre in the past.  - defer to hospitalist service +/- neurology consultation  Signed, Tegan Burnside A. MD 09/25/2013, 3:02 AM

## 2013-09-25 NOTE — Progress Notes (Signed)
ANTICOAGULATION CONSULT NOTE - Initial Consult  Pharmacy Consult for Heparin  Indication: chest pain/ACS  No Known Allergies  Patient Measurements: Height: 5' 5.5" (166.4 cm) Weight: 160 lb (72.576 kg) IBW/kg (Calculated) : 58.15  Vital Signs: Temp: 98.2 F (36.8 C) (07/24 1717) Temp src: Oral (07/24 1717) BP: 162/79 mmHg (07/25 0400) Pulse Rate: 75 (07/25 0400)  Labs:  Recent Labs  09/24/13 1732 09/24/13 2059  HGB 14.4  --   HCT 43.5  --   PLT 217  --   CREATININE 0.78  --   TROPONINI  --  0.35*    Estimated Creatinine Clearance: 72.7 ml/min (by C-G formula based on Cr of 0.78).   Medical History: Past Medical History  Diagnosis Date  . Guillain Barr syndrome   . Hypertension   . Edema     Assessment: 63 y/o F to start heparin for NSTEMI. CBC good, renal function good, other labs as above.   Goal of Therapy:  Heparin level 0.3-0.7 units/ml Monitor platelets by anticoagulation protocol: Yes   Plan:  -Heparin 4000 units BOLUS -Start heparin drip at 900 units/hr -1300 HL -Daily CBC/HL -Monitor for bleeding  Tracie Torres, [place] Coffin 09/25/2013,4:37 AM

## 2013-09-25 NOTE — ED Provider Notes (Signed)
Signed out pending CTA.  CTA negative for acute PE. It appears patient has had an in STEMI. She also has multiple other complaints. Patient was evaluated by cardiology who requests medicine to admit.  Results for orders placed during the hospital encounter of 09/24/13  MRSA PCR SCREENING      Result Value Ref Range   MRSA by PCR NEGATIVE  NEGATIVE  CBC WITH DIFFERENTIAL      Result Value Ref Range   WBC 7.2  4.0 - 10.5 K/uL   RBC 4.97  3.87 - 5.11 MIL/uL   Hemoglobin 14.4  12.0 - 15.0 g/dL   HCT 57.8  46.9 - 62.9 %   MCV 87.5  78.0 - 100.0 fL   MCH 29.0  26.0 - 34.0 pg   MCHC 33.1  30.0 - 36.0 g/dL   RDW 52.8  41.3 - 24.4 %   Platelets 217  150 - 400 K/uL   Neutrophils Relative % 74  43 - 77 %   Neutro Abs 5.3  1.7 - 7.7 K/uL   Lymphocytes Relative 19  12 - 46 %   Lymphs Abs 1.4  0.7 - 4.0 K/uL   Monocytes Relative 6  3 - 12 %   Monocytes Absolute 0.5  0.1 - 1.0 K/uL   Eosinophils Relative 1  0 - 5 %   Eosinophils Absolute 0.1  0.0 - 0.7 K/uL   Basophils Relative 0  0 - 1 %   Basophils Absolute 0.0  0.0 - 0.1 K/uL  BASIC METABOLIC PANEL      Result Value Ref Range   Sodium 143  137 - 147 mEq/L   Potassium 3.6 (*) 3.7 - 5.3 mEq/L   Chloride 104  96 - 112 mEq/L   CO2 22  19 - 32 mEq/L   Glucose, Bld 118 (*) 70 - 99 mg/dL   BUN 14  6 - 23 mg/dL   Creatinine, Ser 0.10  0.50 - 1.10 mg/dL   Calcium 9.1  8.4 - 27.2 mg/dL   GFR calc non Af Amer 87 (*) >90 mL/min   GFR calc Af Amer >90  >90 mL/min   Anion gap 17 (*) 5 - 15  D-DIMER, QUANTITATIVE      Result Value Ref Range   D-Dimer, Quant 1.25 (*) 0.00 - 0.48 ug/mL-FEU  TROPONIN I      Result Value Ref Range   Troponin I 0.35 (*) <0.30 ng/mL  URINE RAPID DRUG SCREEN (HOSP PERFORMED)      Result Value Ref Range   Opiates POSITIVE (*) NONE DETECTED   Cocaine NONE DETECTED  NONE DETECTED   Benzodiazepines NONE DETECTED  NONE DETECTED   Amphetamines POSITIVE (*) NONE DETECTED   Tetrahydrocannabinol NONE DETECTED  NONE  DETECTED   Barbiturates NONE DETECTED  NONE DETECTED  TROPONIN I      Result Value Ref Range   Troponin I <0.30  <0.30 ng/mL  TROPONIN I      Result Value Ref Range   Troponin I 0.33 (*) <0.30 ng/mL  LIPID PANEL      Result Value Ref Range   Cholesterol 139  0 - 200 mg/dL   Triglycerides 66  <536 mg/dL   HDL 70  >64 mg/dL   Total CHOL/HDL Ratio 2.0     VLDL 13  0 - 40 mg/dL   LDL Cholesterol 56  0 - 99 mg/dL  TSH      Result Value Ref Range   TSH 0.656  0.350 - 4.500 uIU/mL  I-STAT TROPOININ, ED      Result Value Ref Range   Troponin i, poc 0.06  0.00 - 0.08 ng/mL   Comment 3           I-STAT TROPOININ, ED      Result Value Ref Range   Troponin i, poc 0.14 (*) 0.00 - 0.08 ng/mL   Comment NOTIFIED PHYSICIAN     Comment 3            Dg Chest 2 View  09/24/2013   CLINICAL DATA:  chest pain  EXAM: CHEST - 2 VIEW  COMPARISON:  None available  FINDINGS: Lungs are clear. Heart size and mediastinal contours are within normal limits. No effusion.  No pneumothorax.  Atheromatous aorta. Visualized skeletal structures are unremarkable.  IMPRESSION: No acute cardiopulmonary disease.   Electronically Signed   By: Oley Balmaniel  Hassell M.D.   On: 09/24/2013 19:05   Ct Head Wo Contrast  09/24/2013   CLINICAL DATA:  Headache and facial numbness.  EXAM: CT HEAD WITHOUT CONTRAST  TECHNIQUE: Contiguous axial images were obtained from the base of the skull through the vertex without intravenous contrast.  COMPARISON:  None.  FINDINGS: The ventricles are normal in size and configuration. No extra-axial fluid collections are identified. The gray-white differentiation is normal. No CT findings for acute intracranial process such as hemorrhage or infarction. No mass lesions. The brainstem and cerebellum are grossly normal.  The bony structures are intact. The paranasal sinuses and mastoid air cells are clear. The globes are intact.  IMPRESSION: No acute intracranial findings or mass lesion.   Electronically Signed    By: Loralie ChampagneMark  Gallerani M.D.   On: 09/24/2013 18:53   Ct Angio Chest Pe W/cm &/or Wo Cm  09/25/2013   EXAM: CT ANGIOGRAPHY CHEST WITH CONTRAST  TECHNIQUE: Multidetector CT imaging of the chest was performed using the standard protocol during bolus administration of intravenous contrast. Multiplanar CT image reconstructions and MIPs were obtained to evaluate the vascular anatomy.  CONTRAST:  100mL OMNIPAQUE IOHEXOL 350 MG/ML SOLN  COMPARISON:  Prior radiograph performed earlier on the same day.  FINDINGS: Thyroid gland is within normal limits.  No pathologically enlarged mediastinal, hilar, or axillary lymph nodes are identified.  Intrathoracic aorta is of normal caliber and appearance. Mild calcified atheromatous disease seen within the aortic arch. Great vessels within normal limits.  Heart size is normal.  No pericardial effusion.  The pulmonary arterial tree is well opacified. No focal filling defect to suggest acute pulmonary embolism. Re-formatted imaging confirms these findings. The main pulmonary artery is of normal caliber measuring 2.1 cm in maximal diameter.  No pulmonary edema or pleural effusion. Subsegmental atelectasis seen dependently within the bilateral lung bases. A more focal 9 mm nodular opacity seen within the peripheral right lung base (series 6, image 63). No other pulmonary nodules or masses.  Visualized portions of the upper abdomen are within normal limits.  No acute osseous abnormality. No worrisome lytic or blastic osseous lesions.  Review of the MIP images confirms the above findings.  IMPRESSION: 1. No CT evidence of acute pulmonary embolism. 2. No other acute cardiopulmonary abnormality identified. 3. Bibasilar atelectasis. 4. 8 mm irregular nodular opacity within the right lung base, indeterminate. If the patient is at high risk for bronchogenic carcinoma, follow-up chest CT at 3-666months is recommended. If the patient is at low risk for bronchogenic carcinoma, follow-up chest CT at  6-12 months is recommended. This recommendation follows the  consensus statement: Guidelines for Management of Small Pulmonary Nodules Detected on CT Scans: A Statement from the Fleischner Society as published in Radiology 2005; 237:395-400.   Electronically Signed   By: Rise Mu M.D.   On: 09/25/2013 01:15      Shon Baton, MD 09/25/13 [phone]

## 2013-09-25 NOTE — Progress Notes (Signed)
Patient seen and examined. See fellow consult note. Patient admitted with chest pain and troponin mildly elevated. She is presently pain-free. Electrocardiogram with nonspecific ST changes. She will need cardiac catheterization. The risks and benefits were discussed and she agrees to proceed. Plan Monday unless she becomes unstable. Continue present medications. Note chest CT showed pulmonary nodule; will need fu per primary care. Await echo for heart murmur.  Tracie MillersBrian Crenshaw

## 2013-09-25 NOTE — ED Provider Notes (Signed)
Medical screening examination/treatment/procedure(s) were conducted as a shared visit with non-physician practitioner(s) and myself.  I personally evaluated the patient during the encounter.   EKG Interpretation   Date/Time:  Friday September 24 2013 23:25:16 EDT Ventricular Rate:  79 PR Interval:  157 QRS Duration: 75 QT Interval:  398 QTC Calculation: 456 R Axis:   28 Text Interpretation:  Sinus rhythm Minimal ST depression, lateral leads  Confirmed by Nevin Grizzle  MD, Caryn BeeKEVIN ([id number]) on 09/24/2013 11:33:07 PM      Pt c/o gradual onset frontal headache, mod-severe. Denies hx chronic neck pain. No associated numbness/weakness. No change in vision or speech.  Pt also c/o mid chest pain, dull, non radiating, mildly pleuritic at times. States chest pain currently markedly improved, all but entirely resolved.  ddimer elev.  Will get ct angio.  Trop also mildly elevated - on recheck, no chest pain. Plan to get ct angio - if neg for PE, will need admission for elevated trop/suspected acs.      Suzi RootsKevin E Lashannon Bresnan, MD 09/25/13 1534

## 2013-09-25 NOTE — Progress Notes (Signed)
  Echocardiogram 2D Echocardiogram has been performed.  Nida Manfredi FRANCES 09/25/2013, 11:48 AM

## 2013-09-25 NOTE — Progress Notes (Addendum)
Patient admitted by Dr. Julian ReilGardner this AM. Please see H&P.  Cards following 8mm irregular nodular opacity within the right lung base, indeterminate. If the patient is at high risk for bronchogenic carcinoma, follow-up chest CT at 3-696months is recommended  Tracie CanaryJessica Vann DO

## 2013-09-26 LAB — BASIC METABOLIC PANEL
ANION GAP: 11 (ref 5–15)
BUN: 17 mg/dL (ref 6–23)
CALCIUM: 8.7 mg/dL (ref 8.4–10.5)
CHLORIDE: 107 meq/L (ref 96–112)
CO2: 23 mEq/L (ref 19–32)
Creatinine, Ser: 0.86 mg/dL (ref 0.50–1.10)
GFR calc Af Amer: 82 mL/min — ABNORMAL LOW (ref >90)
GFR calc non Af Amer: 70 mL/min — ABNORMAL LOW (ref >90)
Glucose, Bld: 103 mg/dL — ABNORMAL HIGH (ref 70–99)
Potassium: 4 mEq/L (ref 3.7–5.3)
SODIUM: 141 meq/L (ref 137–147)

## 2013-09-26 LAB — HEPARIN LEVEL (UNFRACTIONATED)
HEPARIN UNFRACTIONATED: 0.45 [IU]/mL (ref 0.30–0.70)
Heparin Unfractionated: 0.27 IU/mL — ABNORMAL LOW (ref 0.30–0.70)

## 2013-09-26 LAB — CBC
HCT: 38.3 % (ref 36.0–46.0)
Hemoglobin: 12.4 g/dL (ref 12.0–15.0)
MCH: 28.4 pg (ref 26.0–34.0)
MCHC: 32.4 g/dL (ref 30.0–36.0)
MCV: 87.8 fL (ref 78.0–100.0)
PLATELETS: 229 10*3/uL (ref 150–400)
RBC: 4.36 MIL/uL (ref 3.87–5.11)
RDW: 13.5 % (ref 11.5–15.5)
WBC: 5.9 10*3/uL (ref 4.0–10.5)

## 2013-09-26 NOTE — Progress Notes (Signed)
PROGRESS NOTE  Tracie Torres ZOX:[id number] DOB: [birth year] DOA: 09/24/2013 PCP: No PCP Per Patient  Assessment/Plan: NSTEMI - demand ischemia due to amphetamine use seems the most likely (wether intentional ingestion of amphetamines or accidental poisoning, UDS is positive for amphetamines)  Troponin stable Cardiology has been consulted- ASA 81 daily  Heparin gtt per cards recs A1C 5.6, lipid panel HDL- 70, TSH- 0.656 Atorvastatin 80mg  daily and metoprolol 12.5 mg BID. Tele monitor       Echo: Mild LVH with vigorous LVEF 75-80%, grade 1 diastolic dysfunction. Evidence of mid cavitary LVOT obstruction with 47nnHg gradient       likely explains systolic murmur. Trivial mitral and tricuspid regurgitation. Trivial pericardial effusion.   Amphetamine ingestion -  -she maintains accidental ingestion and not intentional use. Counseled patient on the effects of amphetamine as well as tobacco smoking cessation.  Tobacco abuse   Transfer patient to tele bed for cath in AM- if ok with cardiology   Code Status: full Family Communication: patient Disposition Plan:    Consultants:  cardiology  Procedures:    Antibiotics:    HPI/Subjective: No SOB, no chest pain  Objective: Filed Vitals:   09/26/13 0310  BP: 124/57  Pulse: 63  Temp: 97.9 F (36.6 C)  Resp: 16    Intake/Output Summary (Last 24 hours) at 09/26/13 0744 Last data filed at 09/26/13 0700  Gross per 24 hour  Intake 788.44 ml  Output   1850 ml  Net -1061.56 ml   Filed Weights   09/24/13 1715 09/25/13 0541  Weight: 72.576 kg (160 lb) 78.4 kg (172 lb 13.5 oz)    Exam:   General:  A+Ox3, NAD  Cardiovascular: rrr  Respiratory: clear  Abdomen: +BS, soft  Musculoskeletal: -c/c/e  Data Reviewed: Basic Metabolic Panel:  Recent Labs Lab 09/24/13 1732 09/26/13 0239  NA 143 141  K 3.6* 4.0  CL 104 107  CO2 22 23  GLUCOSE 118* 103*  BUN 14 17  CREATININE 0.78 0.86  CALCIUM 9.1 8.7    Liver Function Tests: No results found for this basename: AST, ALT, ALKPHOS, BILITOT, PROT, ALBUMIN,  in the last 168 hours No results found for this basename: LIPASE, AMYLASE,  in the last 168 hours No results found for this basename: AMMONIA,  in the last 168 hours CBC:  Recent Labs Lab 09/24/13 1732 09/25/13 1225 09/26/13 0239  WBC 7.2 5.1 5.9  NEUTROABS 5.3  --   --   HGB 14.4 12.2 12.4  HCT 43.5 37.6 38.3  MCV 87.5 87.4 87.8  PLT 217 223 229   Cardiac Enzymes:  Recent Labs Lab 09/24/13 2059 09/25/13 0545 09/25/13 0810 09/25/13 1225  TROPONINI 0.35* <0.30 0.33* <0.30   BNP (last 3 results) No results found for this basename: PROBNP,  in the last 8760 hours CBG: No results found for this basename: GLUCAP,  in the last 168 hours  Recent Results (from the past 240 hour(s))  MRSA PCR SCREENING     Status: None   Collection Time    09/25/13  4:55 AM      Result Value Ref Range Status   MRSA by PCR NEGATIVE  NEGATIVE Final   Comment:            The GeneXpert MRSA Assay (FDA     approved for NASAL specimens     only), is one component of a     comprehensive MRSA colonization     surveillance program. It is not  intended to diagnose MRSA     infection nor to guide or     monitor treatment for     MRSA infections.     Studies: Dg Chest 2 View  09/24/2013   CLINICAL DATA:  chest pain  EXAM: CHEST - 2 VIEW  COMPARISON:  None available  FINDINGS: Lungs are clear. Heart size and mediastinal contours are within normal limits. No effusion.  No pneumothorax.  Atheromatous aorta. Visualized skeletal structures are unremarkable.  IMPRESSION: No acute cardiopulmonary disease.   Electronically Signed   By: Oley Balmaniel  Hassell M.D.   On: 09/24/2013 19:05   Ct Head Wo Contrast  09/24/2013   CLINICAL DATA:  Headache and facial numbness.  EXAM: CT HEAD WITHOUT CONTRAST  TECHNIQUE: Contiguous axial images were obtained from the base of the skull through the vertex without  intravenous contrast.  COMPARISON:  None.  FINDINGS: The ventricles are normal in size and configuration. No extra-axial fluid collections are identified. The gray-white differentiation is normal. No CT findings for acute intracranial process such as hemorrhage or infarction. No mass lesions. The brainstem and cerebellum are grossly normal.  The bony structures are intact. The paranasal sinuses and mastoid air cells are clear. The globes are intact.  IMPRESSION: No acute intracranial findings or mass lesion.   Electronically Signed   By: Loralie ChampagneMark  Gallerani M.D.   On: 09/24/2013 18:53   Ct Angio Chest Pe W/cm &/or Wo Cm  09/25/2013   EXAM: CT ANGIOGRAPHY CHEST WITH CONTRAST  TECHNIQUE: Multidetector CT imaging of the chest was performed using the standard protocol during bolus administration of intravenous contrast. Multiplanar CT image reconstructions and MIPs were obtained to evaluate the vascular anatomy.  CONTRAST:  100mL OMNIPAQUE IOHEXOL 350 MG/ML SOLN  COMPARISON:  Prior radiograph performed earlier on the same day.  FINDINGS: Thyroid gland is within normal limits.  No pathologically enlarged mediastinal, hilar, or axillary lymph nodes are identified.  Intrathoracic aorta is of normal caliber and appearance. Mild calcified atheromatous disease seen within the aortic arch. Great vessels within normal limits.  Heart size is normal.  No pericardial effusion.  The pulmonary arterial tree is well opacified. No focal filling defect to suggest acute pulmonary embolism. Re-formatted imaging confirms these findings. The main pulmonary artery is of normal caliber measuring 2.1 cm in maximal diameter.  No pulmonary edema or pleural effusion. Subsegmental atelectasis seen dependently within the bilateral lung bases. A more focal 9 mm nodular opacity seen within the peripheral right lung base (series 6, image 63). No other pulmonary nodules or masses.  Visualized portions of the upper abdomen are within normal limits.  No  acute osseous abnormality. No worrisome lytic or blastic osseous lesions.  Review of the MIP images confirms the above findings.  IMPRESSION: 1. No CT evidence of acute pulmonary embolism. 2. No other acute cardiopulmonary abnormality identified. 3. Bibasilar atelectasis. 4. 8 mm irregular nodular opacity within the right lung base, indeterminate. If the patient is at high risk for bronchogenic carcinoma, follow-up chest CT at 3-646months is recommended. If the patient is at low risk for bronchogenic carcinoma, follow-up chest CT at 6-12 months is recommended. This recommendation follows the consensus statement: Guidelines for Management of Small Pulmonary Nodules Detected on CT Scans: A Statement from the Fleischner Society as published in Radiology 2005; 237:395-400.   Electronically Signed   By: Rise MuBenjamin  McClintock M.D.   On: 09/25/2013 01:15    Scheduled Meds: . aspirin  81 mg Oral Daily  . atorvastatin  80 mg Oral q1800  . metoprolol tartrate  12.5 mg Oral BID  . sodium chloride  3 mL Intravenous Q12H   Continuous Infusions: . sodium chloride 10 mL (09/26/13 0444)  . 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 If 7PM-7AM, please contact night-coverage at [email or web address], password [facility] 09/26/2013, 7:44 AM  LOS: 2 days

## 2013-09-26 NOTE — Progress Notes (Signed)
ANTICOAGULATION CONSULT NOTE - Initial Consult  Pharmacy Consult for Heparin  Indication: chest pain/ACS  No Known Allergies  Patient Measurements: Height: 5\' 5"  (165.1 cm) Weight: 172 lb 13.5 oz (78.4 kg) IBW/kg (Calculated) : 57  Vital Signs: Temp: 97.9 F (36.6 C) (07/26 0310) Temp src: Oral (07/26 0310) BP: 124/57 mmHg (07/26 0310) Pulse Rate: 63 (07/26 0310)  Labs:  Recent Labs  09/24/13 1732  09/25/13 0545 09/25/13 0810 09/25/13 1225 09/25/13 1830 09/26/13 0239  HGB 14.4  --   --   --  12.2  --  12.4  HCT 43.5  --   --   --  37.6  --  38.3  PLT 217  --   --   --  223  --  229  HEPARINUNFRC  --   --   --   --  <0.10* 0.34 0.27*  CREATININE 0.78  --   --   --   --   --  0.86  TROPONINI  --   < > <0.30 0.33* <0.30  --   --   < > = values in this interval not displayed.  Estimated Creatinine Clearance: 69.3 ml/min (by C-G formula based on Cr of 0.86).   Medical History: Past Medical History  Diagnosis Date  . Guillain Barr syndrome   . Hypertension   . Edema     Assessment: 63 y/o F on heparin for NSTEMI. CBC good, renal function good, other labs as above, HL is low at 0.27.   Goal of Therapy:  Heparin level 0.3-0.7 units/ml Monitor platelets by anticoagulation protocol: Yes   Plan:  -Increase heparin drip to 1200 units/hr -1200 HL -Daily CBC/HL -Monitor for bleeding  Abran DukeLedford, Larose Batres 09/26/2013,4:41 AM

## 2013-09-26 NOTE — Progress Notes (Signed)
    Subjective:  Denies CP or dyspnea   Objective:  Filed Vitals:   09/25/13 2300 09/26/13 0000 09/26/13 0310 09/26/13 0802  BP: 135/72  124/57 174/92  Pulse: 62  63 65  Temp:  98 F (36.7 C) 97.9 F (36.6 C) 98.1 F (36.7 C)  TempSrc:  Oral Oral Oral  Resp: 15  16 15   Height:      Weight:      SpO2: 99%  98% 100%    Intake/Output from previous day:  Intake/Output Summary (Last 24 hours) at 09/26/13 1041 Last data filed at 09/26/13 0804  Gross per 24 hour  Intake 788.44 ml  Output   1600 ml  Net -811.56 ml    Physical Exam: Physical exam: Well-developed well-nourished in no acute distress.  Skin is warm and dry.  HEENT is normal.  Neck is supple.  Chest is clear to auscultation with normal expansion.  Cardiovascular exam is regular rate and rhythm.  Abdominal exam nontender or distended. No masses palpated. Extremities show no edema. neuro grossly intact    Lab Results: Basic Metabolic Panel:  Recent Labs  47/82/9507/24/15 1732 09/26/13 0239  NA 143 141  K 3.6* 4.0  CL 104 107  CO2 22 23  GLUCOSE 118* 103*  BUN 14 17  CREATININE 0.78 0.86  CALCIUM 9.1 8.7   CBC:  Recent Labs  09/24/13 1732 09/25/13 1225 09/26/13 0239  WBC 7.2 5.1 5.9  NEUTROABS 5.3  --   --   HGB 14.4 12.2 12.4  HCT 43.5 37.6 38.3  MCV 87.5 87.4 87.8  PLT 217 223 229   Cardiac Enzymes:  Recent Labs  09/25/13 0545 09/25/13 0810 09/25/13 1225  TROPONINI <0.30 0.33* <0.30     Assessment/Plan:  1 Chest pain - Enzymes 0.35, <0.3, 0.33, <0.3. Not clear why minimally positive alternating with normal. Discussed with patient. She would prefer noninvasive evaluation if possible and I think this is appropriate. Plan nuclear study in AM; cath if abnormal. 2 Murmur-echo shows vigorous LV function with intracavitary gradient. 3 Tobacco abuse-patient should discontinue. 4 Lung nodule-fu per primary care.    Olga MillersBrian Crenshaw 09/26/2013, 10:41 AM

## 2013-09-26 NOTE — Progress Notes (Signed)
ANTICOAGULATION CONSULT NOTE - Follow Up Consult  Pharmacy Consult for heparin Indication: chest pain/ACS  No Known Allergies  Patient Measurements: Height: 5\' 5"  (165.1 cm) Weight: 172 lb 13.5 oz (78.4 kg) IBW/kg (Calculated) : 57  Vital Signs: Temp: 97.7 F (36.5 C) (07/26 1244) Temp src: Oral (07/26 1244) BP: 149/70 mmHg (07/26 1244) Pulse Rate: 58 (07/26 1244)  Labs:  Recent Labs  09/24/13 1732  09/25/13 0545 09/25/13 0810  09/25/13 1225 09/25/13 1830 09/26/13 0239 09/26/13 1131  HGB 14.4  --   --   --   --  12.2  --  12.4  --   HCT 43.5  --   --   --   --  37.6  --  38.3  --   PLT 217  --   --   --   --  223  --  229  --   HEPARINUNFRC  --   --   --   --   < > <0.10* 0.34 0.27* 0.45  CREATININE 0.78  --   --   --   --   --   --  0.86  --   TROPONINI  --   < > <0.30 0.33*  --  <0.30  --   --   --   < > = values in this interval not displayed.  Estimated Creatinine Clearance: 69.3 ml/min (by C-G formula based on Cr of 0.86).  Assessment: Patient is a 63 y.o F on heparin for ACS with plan for stress test on 7/27.  Heparin level now back therapeutic at 0.45 after rate increased to 1200 units/hr. No bleeding documented.  Goal of Therapy:  Heparin level 0.3-0.7 units/ml Monitor platelets by anticoagulation protocol: Yes   Plan:  1) continue heparin drip at 1200 units/hr  Franchon Ketterman P 09/26/2013,1:00 PM

## 2013-09-27 ENCOUNTER — Ambulatory Visit (HOSPITAL_COMMUNITY): Payer: Self-pay

## 2013-09-27 ENCOUNTER — Inpatient Hospital Stay (HOSPITAL_COMMUNITY): Payer: Self-pay

## 2013-09-27 DIAGNOSIS — R911 Solitary pulmonary nodule: Secondary | ICD-10-CM

## 2013-09-27 DIAGNOSIS — R079 Chest pain, unspecified: Secondary | ICD-10-CM

## 2013-09-27 DIAGNOSIS — R072 Precordial pain: Secondary | ICD-10-CM

## 2013-09-27 LAB — HEPARIN LEVEL (UNFRACTIONATED): Heparin Unfractionated: 0.45 IU/mL (ref 0.30–0.70)

## 2013-09-27 LAB — CBC
HCT: 38.1 % (ref 36.0–46.0)
Hemoglobin: 12.6 g/dL (ref 12.0–15.0)
MCH: 28.9 pg (ref 26.0–34.0)
MCHC: 33.1 g/dL (ref 30.0–36.0)
MCV: 87.4 fL (ref 78.0–100.0)
PLATELETS: 213 10*3/uL (ref 150–400)
RBC: 4.36 MIL/uL (ref 3.87–5.11)
RDW: 13.3 % (ref 11.5–15.5)
WBC: 6.5 10*3/uL (ref 4.0–10.5)

## 2013-09-27 MED ORDER — TECHNETIUM TC 99M SESTAMIBI GENERIC - CARDIOLITE
10.0000 | Freq: Once | INTRAVENOUS | Status: AC | PRN
  Administered 2013-09-27: 10 via INTRAVENOUS

## 2013-09-27 MED ORDER — TRIAMTERENE-HCTZ 37.5-25 MG PO TABS
1.0000 | ORAL_TABLET | Freq: Every day | ORAL | Status: DC
  Administered 2013-09-27: 1 via ORAL
  Filled 2013-09-27 (×2): qty 1

## 2013-09-27 MED ORDER — TECHNETIUM TC 99M SESTAMIBI GENERIC - CARDIOLITE
30.0000 | Freq: Once | INTRAVENOUS | Status: AC | PRN
  Administered 2013-09-27: 30 via INTRAVENOUS

## 2013-09-27 MED ORDER — FUROSEMIDE 20 MG PO TABS
20.0000 mg | ORAL_TABLET | Freq: Every day | ORAL | Status: DC
  Administered 2013-09-27: 20 mg via ORAL
  Filled 2013-09-27: qty 1

## 2013-09-27 MED ORDER — METOPROLOL TARTRATE 12.5 MG HALF TABLET: 12.5000 mg | ORAL_TABLET | Freq: Two times a day (BID) | ORAL | Status: AC

## 2013-09-27 NOTE — Progress Notes (Signed)
Reviewed discharge instructions with patient, questions answered, dc home with belongings instructions, prescriptions Georgette DoverGlover, Juliene Kirsh A

## 2013-09-27 NOTE — Progress Notes (Signed)
GXT MV performed. Pt struggled to reach target HR, limiting factor was SOB, no chest pain (had metoprolol 12.5 mg last pm). ECG w/ much artifact but no obvious ischemic changes.

## 2013-09-27 NOTE — Progress Notes (Signed)
Patient Name: Tracie Torres Date of Encounter: 09/27/2013  Principal Problem:   NSTEMI (non-ST elevated myocardial infarction) Active Problems:   Heart murmur   Amphetamine and psychostimulant intoxication with complication    Patient Profile: 63 yo female w/ HTN, remote heroin, was admitted 07/24 with chest pain. Ez slightly up at 1 point, otherwise negative. ECG not acute. BC saw 07/26 and felt MV would be OK. Pt wants to walk.  SUBJECTIVE: No more chest pain, no SOB.  OBJECTIVE Filed Vitals:   09/26/13 0802 09/26/13 1244 09/26/13 2044 09/27/13 0541  BP: 174/92 149/70 170/98 168/71  Pulse: 65 58 64 53  Temp: 98.1 F (36.7 C) 97.7 F (36.5 C) 97.5 F (36.4 C) 97.9 F (36.6 C)  TempSrc: Oral Oral Oral Oral  Resp: 15 17 18 18   Height:      Weight:      SpO2: 100% 100% 99% 100%    Intake/Output Summary (Last 24 hours) at 09/27/13 1031 Last data filed at 09/26/13 2146  Gross per 24 hour  Intake    666 ml  Output      0 ml  Net    666 ml   Filed Weights   09/24/13 1715 09/25/13 0541  Weight: 160 lb (72.576 kg) 172 lb 13.5 oz (78.4 kg)    PHYSICAL EXAM General: Well developed, well nourished, female in no acute distress. Head: Normocephalic, atraumatic. Poor dentition.  Neck: Supple without bruits, JVD not elevated Lungs:  Resp regular and unlabored, CTA. Heart: RRR, S1, S2, no S3, S4, soft systolic murmur; no rub. Abdomen: Soft, non-tender, non-distended, BS + x 4.  Extremities: No clubbing, cyanosis, no edema.  Neuro: Alert and oriented X 3. Moves all extremities spontaneously. Psych: Normal affect.  LABS: CBC: Recent Labs  09/24/13 1732  09/26/13 0239 09/27/13 0520  WBC 7.2  < > 5.9 6.5  NEUTROABS 5.3  --   --   --   HGB 14.4  < > 12.4 12.6  HCT 43.5  < > 38.3 38.1  MCV 87.5  < > 87.8 87.4  PLT 217  < > 229 213  < > = values in this interval not displayed.  Basic Metabolic Panel: Recent Labs  09/24/13 1732 09/26/13 0239  NA 143 141   K 3.6* 4.0  CL 104 107  CO2 22 23  GLUCOSE 118* 103*  BUN 14 17  CREATININE 0.78 0.86  CALCIUM 9.1 8.7   Cardiac Enzymes: Recent Labs  09/25/13 0545 09/25/13 0810 09/25/13 1225  TROPONINI <0.30 0.33* <0.30    Recent Labs  09/24/13 1743 09/24/13 2044  TROPIPOC 0.06 0.14*   D-dimer: Recent Labs  09/24/13 2022  DDIMER 1.25*   Hemoglobin A1C: Recent Labs  09/25/13 0545  HGBA1C 5.6   Fasting Lipid Panel: Recent Labs  09/25/13 0545  CHOL 139  HDL 70  LDLCALC 56  TRIG 66  CHOLHDL 2.0   Thyroid Function Tests: Recent Labs  09/25/13 0545  TSH 0.656   Drugs of Abuse     Component Value Date/Time   LABOPIA POSITIVE* 09/24/2013 2218   COCAINSCRNUR NONE DETECTED 09/24/2013 2218   LABBENZ NONE DETECTED 09/24/2013 2218   AMPHETMU POSITIVE* 09/24/2013 2218   THCU NONE DETECTED 09/24/2013 2218   LABBARB NONE DETECTED 09/24/2013 2218     TELE: SR, seen in nuc med    Echo: 09/25/2013 Study Conclusions - Left ventricle: The cavity size was normal. Wall thickness was increased in a pattern of mild  LVH. Systolic function was vigorous. The estimated ejection fraction was in the range of 75% to 80%. Evidence of mid cavaitary LVOT obstruction. Mid intracavitary gradient of 47 mmHg. Wall motion was normal; there were no regional wall motion abnormalities. Doppler parameters are consistent with abnormal left ventricular relaxation (grade 1 diastolic dysfunction). Doppler parameters are consistent with elevated ventricular end-diastolic filling pressure. - Aortic valve: Mildly calcified annulus. Trileaflet. There was no significant regurgitation. - Mitral valve: There was trivial regurgitation. - Left atrium: The atrium was at the upper limits of normal in size. - Atrial septum: No defect or patent foramen ovale was identified. - Tricuspid valve: There was trivial regurgitation. - Pericardium, extracardiac: A trivial pericardial effusion  was identified. Impressions: - Mild LVH with vigorous LVEF 75-80%, grade 1 diastolic dysfunction. Evidence of mid cavitary LVOT obstruction with 47 nnHg gradient - likely explains systolic murmur. Trivial mitral and tricuspid regurgitation. Trivial pericardial effusion.    Dg Chest 2 View 09/24/2013   CLINICAL DATA:  chest pain  EXAM: CHEST - 2 VIEW  COMPARISON:  None available  FINDINGS: Lungs are clear. Heart size and mediastinal contours are within normal limits. No effusion.  No pneumothorax.  Atheromatous aorta. Visualized skeletal structures are unremarkable.  IMPRESSION: No acute cardiopulmonary disease.   Electronically Signed   By: Oley Balm M.D.   On: 09/24/2013 19:05   Ct Head Wo Contrast 09/24/2013   CLINICAL DATA:  Headache and facial numbness.  EXAM: CT HEAD WITHOUT CONTRAST  TECHNIQUE: Contiguous axial images were obtained from the base of the skull through the vertex without intravenous contrast.  COMPARISON:  None.  FINDINGS: The ventricles are normal in size and configuration. No extra-axial fluid collections are identified. The gray-white differentiation is normal. No CT findings for acute intracranial process such as hemorrhage or infarction. No mass lesions. The brainstem and cerebellum are grossly normal.  The bony structures are intact. The paranasal sinuses and mastoid air cells are clear. The globes are intact.  IMPRESSION: No acute intracranial findings or mass lesion.   Electronically Signed   By: Loralie Champagne M.D.   On: 09/24/2013 18:53   Ct Angio Chest Pe W/cm &/or Wo Cm 09/25/2013   EXAM: CT ANGIOGRAPHY CHEST WITH CONTRAST  TECHNIQUE: Multidetector CT imaging of the chest was performed using the standard protocol during bolus administration of intravenous contrast. Multiplanar CT image reconstructions and MIPs were obtained to evaluate the vascular anatomy.  CONTRAST:  OMNIPAQUE IOHEXOL 350 MG/ML SOLN  COMPARISON:  Prior radiograph performed earlier on the  same day.  FINDINGS: Thyroid gland is within normal limits.  No pathologically enlarged mediastinal, hilar, or axillary lymph nodes are identified.  Intrathoracic aorta is of normal caliber and appearance. Mild calcified atheromatous disease seen within the aortic arch. Great vessels within normal limits.  Heart size is normal.  No pericardial effusion.  The pulmonary arterial tree is well opacified. No focal filling defect to suggest acute pulmonary embolism. Re-formatted imaging confirms these findings. The main pulmonary artery is of normal caliber measuring 2.1 cm in maximal diameter.  No pulmonary edema or pleural effusion. Subsegmental atelectasis seen dependently within the bilateral lung bases. A more focal 9 mm nodular opacity seen within the peripheral right lung base (series 6, image 63). No other pulmonary nodules or masses.  Visualized portions of the upper abdomen are within normal limits.  No acute osseous abnormality. No worrisome lytic or blastic osseous lesions.  Review of the MIP images confirms the above findings.  IMPRESSION: 1. No CT evidence of acute pulmonary embolism. 2. No other acute cardiopulmonary abnormality identified. 3. Bibasilar atelectasis. 4. 8 mm irregular nodular opacity within the right lung base, indeterminate. If the patient is at high risk for bronchogenic carcinoma, follow-up chest CT at 3-6months is recommended. If the patient is at low risk for bronchogenic carcinoma, follow-up chest CT at 6-129 months is recommended. This recommendation follows the consensus statement: Guidelines for Management of Small Pulmonary Nodules Detected on CT Scans: A Statement from the Fleischner Society as published in Radiology 2005; 237:395-400.   Electronically Signed   By: Rise MuBenjamin  McClintock M.D.   On: 09/25/2013 01:15     Current Medications:  . aspirin  81 mg Oral Daily  . atorvastatin  80 mg Oral q1800  . metoprolol tartrate  12.5 mg Oral BID  . sodium chloride  3 mL  Intravenous Q12H   . sodium chloride 10 mL (09/26/13 0444)  . heparin 1,200 Units/hr (09/26/13 2308)    ASSESSMENT AND PLAN: Principal Problem:   NSTEMI (non-ST elevated myocardial infarction) - intermittent ez elevation, but not very high, possibly secondary to amphetamine use. [facility]. Will do GXT, pt preference.  Active Problems:   Heart murmur -  Echo results above.    Amphetamine and psychostimulant intoxication with complication - per IM   Signed, Theodore Demarkhonda Barrett , PA-C 10:31 AM 09/27/2013  I have personally seen and examined this patient with Theodore Demarkhonda Barrett, PA-C. I agree with the assessment and plan as outlined above. Admitted with chest pain. Troponin negative with exception of second one which was slightly abnormal. Stress myoview pending today. If no ischemia on stress test, can be discharged home today. If there is ischemia on the stress test, will need cardiac cath before discharge. She can f/u with Dr. Jens Somrenshaw [facility] after discharge.   Ryland Smoots 09/27/2013 1:07 PM

## 2013-09-27 NOTE — Progress Notes (Signed)
ANTICOAGULATION CONSULT NOTE - Follow Up Consult  Pharmacy Consult for heparin Indication: chest pain/ACS  No Known Allergies  Patient Measurements: Height: 5\' 5"  (165.1 cm) Weight: 172 lb 13.5 oz (78.4 kg) IBW/kg (Calculated) : 57  Vital Signs: Temp: 97.3 F (36.3 C) (07/27 1246) Temp src: Oral (07/27 1246) BP: 146/97 mmHg (07/27 1246) Pulse Rate: 74 (07/27 1246)  Labs:  Recent Labs  09/24/13 1732  09/25/13 0545 09/25/13 0810 09/25/13 1225  09/26/13 0239 09/26/13 1131 09/27/13 0520  HGB 14.4  --   --   --  12.2  --  12.4  --  12.6  HCT 43.5  --   --   --  37.6  --  38.3  --  38.1  PLT 217  --   --   --  223  --  229  --  213  HEPARINUNFRC  --   --   --   --  <0.10*  < > 0.27* 0.45 0.45  CREATININE 0.78  --   --   --   --   --  0.86  --   --   TROPONINI  --   < > <0.30 0.33* <0.30  --   --   --   --   < > = values in this interval not displayed.  Estimated Creatinine Clearance: 69.3 ml/min (by C-G formula based on Cr of 0.86).  Assessment: Today's heparin level remains therapeutic at 0.45 on IV heparin drip 1200 units/hr for ACS/NSTEMI in this 63 y.o female. H/H is stable and PLTC stable at 213. No bleeding documented.   Goal of Therapy:  Heparin level 0.3-0.7 units/ml Monitor platelets by anticoagulation protocol: Yes   Plan:  Continue heparin drip at 1200 units/hr Daily AM Heparin level and CBC  Tracie Torres, RPh Clinical Pharmacist Pager: [phone] 09/27/2013,1:18 PM

## 2013-09-27 NOTE — Discharge Summary (Addendum)
Physician Discharge Summary  Tracie Torres ZOX:[id number] DOB: 04-20-1950 DOA: 09/24/2013  PCP: No PCP Per Patient  Admit date: 09/24/2013 Discharge date: 09/27/2013  Time spent: 35 minutes  Recommendations for Outpatient Follow-up:  8mm irregular nodular opacity within the right lung base, indeterminate. 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 female h/o HTN, remote h/o heroin abuse, h/o GBS. Presents to the ED with an episode of chest pain earlier in the afternoon. Patient reports numbness, tingling, involving face, hands, feet this afternoon after which she experiened a shooting pain in her left chest with radiation to her back. Pain was 8/10 and occurred while she was washing dishes. Symptoms continued and she came to the ED as a result. Pain resolved with morphine. Total duration of chest pain was about 20 mins she states. Did have SOB and lightheadedness with this.  Leading up to yesterday she was asymptomatic. She recently moved to the area from [place].  In the ED POC trop was 0.14, repeat lab troponin was 0.35. D-Dimer was elevated CTA chest was performed and negative. UDS positive for amphetamines.  When discussed with patient she claims no knowledge of the amphetamines, but does admit to buying "4 hand rolled cigarettes from someone because the store was closed on Thursday."   Hospital Course:  NSTEMI - demand ischemia due to amphetamine use seems the most likely (wether  intentional ingestion of amphetamines or accidental poisoning, UDS is positive for amphetamines)  Troponin stable  ASA 81 daily  A1C 5.6, lipid panel HDL- 70, TSH- 0.656  metoprolol 12.5 mg BID.  Echo: Mild LVH with vigorous LVEF 75-80%, grade 1 diastolic dysfunction. Evidence of mid cavitary LVOT obstruction with 47nnHg gradient likely explains systolic murmur. Trivial mitral and tricuspid regurgitation. Trivial pericardial effusion. Stress test- low risk  Amphetamine ingestion -  -she maintains accidental ingestion and not intentional use. Counseled patient on the effects of amphetamine as well as tobacco smoking cessation.   Tobacco abuse   Procedures:  Echo  Stress test  Consultations:  cards  Discharge Exam: Filed Vitals:   09/27/13 1246  BP: 146/97  Pulse: 74  Temp: 97.3 F (36.3 C)  Resp: 20    General: A+Ox3, NAD Cardiovascular: rrr Respiratory: clear  Discharge Instructions You were cared for by a hospitalist during your hospital stay. If you have any questions about your discharge medications or the care you received while you were in the hospital after you are discharged, you can call the unit and asked to speak with the hospitalist on call if the hospitalist that took care of you is not available. Once you are discharged, your primary care physician will handle any further medical issues. Please note that NO REFILLS for any discharge medications will be authorized once you are discharged, as it is imperative that you return to your primary care physician (or establish a relationship with a primary care physician if you do not have one) for your aftercare needs so that they can reassess  your need for medications and monitor your lab values.  Discharge Instructions   Diet - low sodium heart healthy    Complete by:  As directed      Discharge instructions    Complete by:  As directed   . follow-up chest CT at 3-466months is recommended     Increase activity  slowly    Complete by:  As directed             Medication List         aspirin EC 81 MG tablet  Take 81 mg by mouth daily.     furosemide 20 MG tablet  Commonly known as:  LASIX  Take 20 mg by mouth daily.     metoprolol tartrate 12.5 mg Tabs tablet  Commonly known as:  LOPRESSOR  Take 0.5 tablets (12.5 mg total) by mouth 2 (two) times daily.     potassium chloride 10 MEQ tablet  Commonly known as:  K-DUR  Take 10 mEq by mouth daily.     triamterene-hydrochlorothiazide 37.5-25 MG per tablet  Commonly known as:  MAXZIDE-25  Take 1 tablet by mouth daily.       No Known Allergies     Follow-up Information   Follow up with [facility].   Contact information:   [address] [place] [id number] [phone]       The results of significant diagnostics from this hospitalization (including imaging, microbiology, ancillary and laboratory) are listed below for reference.    Significant Diagnostic Studies: Dg Chest 2 View  09/24/2013   CLINICAL DATA:  chest pain  EXAM: CHEST - 2 VIEW  COMPARISON:  None available  FINDINGS: Lungs are clear. Heart size and mediastinal contours are within normal limits. No effusion.  No pneumothorax.  Atheromatous aorta. Visualized skeletal structures are unremarkable.  IMPRESSION: No acute cardiopulmonary disease.   Electronically Signed   By: Oley Balmaniel  Hassell M.D.   On: 09/24/2013 19:05   Ct Head Wo Contrast  09/24/2013   CLINICAL DATA:  Headache and facial numbness.  EXAM: CT HEAD WITHOUT CONTRAST  TECHNIQUE: Contiguous axial images were obtained from the base of the skull through the vertex without intravenous contrast.  COMPARISON:  None.  FINDINGS: The ventricles are normal in size and configuration. No extra-axial fluid collections are identified. The gray-white differentiation is normal. No CT findings for acute intracranial process such as hemorrhage or infarction. No mass lesions. The brainstem  and cerebellum are grossly normal.  The bony structures are intact. The paranasal sinuses and mastoid air cells are clear. The globes are intact.  IMPRESSION: No acute intracranial findings or mass lesion.   Electronically Signed   By: Loralie ChampagneMark  Gallerani M.D.   On: 09/24/2013 18:53   Ct Angio Chest Pe W/cm &/or Wo Cm  09/25/2013   EXAM: CT ANGIOGRAPHY CHEST WITH CONTRAST  TECHNIQUE: Multidetector CT imaging of the chest was performed using the standard protocol during bolus administration of intravenous contrast. Multiplanar CT image reconstructions and MIPs were obtained to evaluate the vascular anatomy.  CONTRAST:  100mL OMNIPAQUE IOHEXOL 350 MG/ML SOLN  COMPARISON:  Prior radiograph performed earlier on the same day.  FINDINGS: Thyroid gland is within normal limits.  No pathologically enlarged mediastinal, hilar, or axillary lymph nodes are identified.  Intrathoracic aorta is of normal caliber and appearance. Mild calcified atheromatous disease seen within the aortic arch. Great vessels within normal limits.  Heart size is normal.  No pericardial effusion.  The pulmonary arterial tree is well opacified. No focal filling defect to suggest acute pulmonary embolism. Re-formatted imaging confirms these findings. The main pulmonary artery is of normal caliber measuring 2.1 cm in maximal diameter.  No pulmonary edema or pleural effusion. Subsegmental atelectasis seen dependently within the bilateral lung bases. A more focal 9 mm nodular opacity seen within the peripheral right lung base (series 6, image 63). No other pulmonary nodules or masses.  Visualized portions of the upper abdomen are within normal limits.  No acute osseous abnormality. No worrisome lytic or blastic osseous lesions.  Review of the MIP images confirms the above findings.  IMPRESSION: 1. No CT evidence of acute pulmonary embolism. 2. No other acute cardiopulmonary abnormality identified. 3. Bibasilar atelectasis. 4. 8 mm irregular nodular opacity  within the right lung base, indeterminate. If the patient is at high risk for bronchogenic carcinoma, follow-up chest CT at 3-25months is recommended. If the patient is at low risk for bronchogenic carcinoma, follow-up chest CT at 6-12 months is recommended. This recommendation follows the consensus statement: Guidelines for Management of Small Pulmonary Nodules Detected on CT Scans: A Statement from the Fleischner Society as published in Radiology 2005; 237:395-400.   Electronically Signed   By: Rise Mu M.D.   On: 09/25/2013 01:15   Nm Myocar Multi W/spect W/wall Motion / Ef  09/27/2013   CLINICAL DATA:  Chest pain  EXAM: Lexiscan Myovue  TECHNIQUE: The patient received IV Lexiscan .4mg  over 15 seconds. 33.0 mCi of Technetium 26m Sestamibi injected at 30 seconds. Quantitative SPECT images were obtained in the vertical, horizontal and short axis planes after a 45 minute delay. Rest images were obtained with similar planes and delay using 10.2 mCi of Technetium 46m Sestamibi.  FINDINGS: ECG: Sinus rhythm, cannot rule out prior septal infarct, nonspecific ST changes. With stress there was significant artifact but no obvious ST changes.  Symptoms: Resting heart rate 63 and increased to a maximum of 137. Blood pressure increased to 182/69. No chest pain during the study. There was dyspnea.  RAW Data:  Adequate image acquisition.  Quantitiative Gated SPECT EF: 78% with normal wall motion. TID- 1.02. End-diastolic volume-70 cc. End systolic volume -15 cc.  Perfusion Images: The stress images reveal a small defect of mild intensity in the inferior lateral wall. When compared to the rest images there is no reversibility noted.  IMPRESSION: Low Risk stress nuclear study with no chest pain, no obvious ST changes and the scintigraphic results reveal inferior lateral thinning but no ischemia. The gated ejection fraction was 78% with normal wall motion.  Olga Millers   Electronically Signed   By: Olga Millers    On: 09/27/2013 16:21    Microbiology: Recent Results (from the past 240 hour(s))  MRSA PCR SCREENING     Status: None   Collection Time    09/25/13  4:55 AM      Result Value Ref Range Status   MRSA by PCR NEGATIVE  NEGATIVE Final   Comment:            The GeneXpert MRSA Assay (FDA     approved for NASAL specimens     only), is one component of a     comprehensive MRSA colonization     surveillance program. It is not     intended to diagnose MRSA     infection nor to guide or     monitor treatment for     MRSA infections.  Labs: Basic Metabolic Panel:  Recent Labs Lab 09/24/13 1732 09/26/13 0239  NA 143 141  K 3.6* 4.0  CL 104 107  CO2 22 23  GLUCOSE 118* 103*  BUN 14 17  CREATININE 0.78 0.86  CALCIUM 9.1 8.7   Liver Function Tests: No results found for this basename: AST, ALT, ALKPHOS, BILITOT, PROT, ALBUMIN,  in the last 168 hours No results found for this basename: LIPASE, AMYLASE,  in the last 168 hours No results found for this basename: AMMONIA,  in the last 168 hours CBC:  Recent Labs Lab 09/24/13 1732 09/25/13 1225 09/26/13 0239 09/27/13 0520  WBC 7.2 5.1 5.9 6.5  NEUTROABS 5.3  --   --   --   HGB 14.4 12.2 12.4 12.6  HCT 43.5 37.6 38.3 38.1  MCV 87.5 87.4 87.8 87.4  PLT 217 223 229 213   Cardiac Enzymes:  Recent Labs Lab 09/24/13 2059 09/25/13 0545 09/25/13 0810 09/25/13 1225  TROPONINI 0.35* <0.30 0.33* <0.30   BNP: BNP (last 3 results) No results found for this basename: PROBNP,  in the last 8760 hours CBG: No results found for this basename: GLUCAP,  in the last 168 hours     Signed:  Marlin Canary  [facility] 09/27/2013, 4:30 PM

## 2013-09-27 NOTE — Care Management Note (Unsigned)
    Page 1 of 1   09/27/2013     4:27:28 PM CARE MANAGEMENT NOTE 09/27/2013  Patient:  Tracie Torres,Tracie Torres   Account Number:  [id number]  Date Initiated:  09/27/2013  Documentation initiated by:  Aquiles Ruffini  Subjective/Objective Assessment:   Pt adm on 09/24/13 with NSTEMI.  PTA, pt independent of ADLS.  Pt has no insurance or PCP.     Action/Plan:   CM consult for PCP issues and med assistance, if eligible. Will follow up.   Anticipated DC Date:  09/28/2013   Anticipated DC Plan:  HOME/SELF CARE      DC Planning Services  CM consult      Choice offered to / List presented to:             Status of service:  In process, will continue to follow Medicare Important Message given?   (If response is "NO", the following Medicare IM given date fields will be blank) Date Medicare IM given:   Medicare IM given by:   Date Additional Medicare IM given:   Additional Medicare IM given by:    Discharge Disposition:    Per UR Regulation:  Reviewed for med. necessity/level of care/duration of stay  If discussed at Long Length of Stay Meetings, dates discussed:    Comments:

## 2013-10-15 ENCOUNTER — Telehealth: Payer: Self-pay | Admitting: Cardiology

## 2013-10-15 ENCOUNTER — Encounter: Payer: Self-pay | Admitting: Cardiology

## 2013-10-15 NOTE — Telephone Encounter (Signed)
Closed encounter °

## 2013-10-26 ENCOUNTER — Ambulatory Visit: Payer: Self-pay | Admitting: Cardiology

## 2013-11-19 ENCOUNTER — Ambulatory Visit: Payer: Self-pay | Admitting: Cardiology

## 2013-12-06 ENCOUNTER — Ambulatory Visit: Payer: Self-pay | Admitting: Cardiology

## 2014-04-04 ENCOUNTER — Encounter (HOSPITAL_COMMUNITY): Payer: Self-pay | Admitting: Emergency Medicine

## 2014-04-04 ENCOUNTER — Emergency Department (HOSPITAL_COMMUNITY)
Admission: EM | Admit: 2014-04-04 | Discharge: 2014-04-04 | Disposition: A | Discharge status: Home / Self Care | Payer: Medicaid Other | Attending: Emergency Medicine | Admitting: Emergency Medicine

## 2014-04-04 DIAGNOSIS — I10 Essential (primary) hypertension: Secondary | ICD-10-CM | POA: Insufficient documentation

## 2014-04-04 DIAGNOSIS — Z79899 Other long term (current) drug therapy: Secondary | ICD-10-CM | POA: Insufficient documentation

## 2014-04-04 DIAGNOSIS — S4992XA Unspecified injury of left shoulder and upper arm, initial encounter: Secondary | ICD-10-CM | POA: Insufficient documentation

## 2014-04-04 DIAGNOSIS — S3992XA Unspecified injury of lower back, initial encounter: Secondary | ICD-10-CM | POA: Diagnosis not present

## 2014-04-04 DIAGNOSIS — Z720 Tobacco use: Secondary | ICD-10-CM | POA: Insufficient documentation

## 2014-04-04 DIAGNOSIS — Y92410 Unspecified street and highway as the place of occurrence of the external cause: Secondary | ICD-10-CM | POA: Diagnosis not present

## 2014-04-04 DIAGNOSIS — M545 Low back pain, unspecified: Secondary | ICD-10-CM

## 2014-04-04 DIAGNOSIS — Z7982 Long term (current) use of aspirin: Secondary | ICD-10-CM | POA: Diagnosis not present

## 2014-04-04 DIAGNOSIS — Z8669 Personal history of other diseases of the nervous system and sense organs: Secondary | ICD-10-CM | POA: Insufficient documentation

## 2014-04-04 DIAGNOSIS — Y9389 Activity, other specified: Secondary | ICD-10-CM | POA: Diagnosis not present

## 2014-04-04 DIAGNOSIS — Y998 Other external cause status: Secondary | ICD-10-CM | POA: Diagnosis not present

## 2014-04-04 DIAGNOSIS — S161XXA Strain of muscle, fascia and tendon at neck level, initial encounter: Secondary | ICD-10-CM | POA: Diagnosis not present

## 2014-04-04 MED ORDER — IBUPROFEN 800 MG PO TABS: 800.0000 mg | ORAL_TABLET | Freq: Three times a day (TID) | ORAL | Status: AC
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
## 2014-04-04 NOTE — Discharge Instructions (Signed)
Call for a follow up appointment with a Family or Primary Care Provider.  Return if Symptoms worsen.   Take medication as prescribed.  Ice your back and neck 3-4 times a day for the first 48 hours. Do some gentle stretching to your neck and lower back.

## 2014-04-12 ENCOUNTER — Other Ambulatory Visit (HOSPITAL_COMMUNITY): Payer: Self-pay | Admitting: Chiropractic Medicine

## 2014-04-12 ENCOUNTER — Ambulatory Visit (HOSPITAL_COMMUNITY)
Admission: RE | Admit: 2014-04-12 | Discharge: 2014-04-12 | Disposition: A | Discharge status: Home / Self Care | Payer: Medicaid Other | Source: Ambulatory Visit | Attending: Chiropractic Medicine | Admitting: Chiropractic Medicine

## 2014-04-12 DIAGNOSIS — M545 Low back pain: Secondary | ICD-10-CM | POA: Diagnosis not present

## 2014-04-12 DIAGNOSIS — M542 Cervicalgia: Secondary | ICD-10-CM

## 2014-04-12 DIAGNOSIS — M25562 Pain in left knee: Secondary | ICD-10-CM

## 2014-04-12 DIAGNOSIS — M47892 Other spondylosis, cervical region: Secondary | ICD-10-CM | POA: Insufficient documentation

## 2014-04-12 DIAGNOSIS — M5489 Other dorsalgia: Secondary | ICD-10-CM

## 2014-04-12 DIAGNOSIS — M170 Bilateral primary osteoarthritis of knee: Secondary | ICD-10-CM | POA: Diagnosis not present

## 2014-04-12 DIAGNOSIS — M25561 Pain in right knee: Secondary | ICD-10-CM | POA: Insufficient documentation

## 2014-04-12 DIAGNOSIS — M546 Pain in thoracic spine: Secondary | ICD-10-CM | POA: Diagnosis not present

## 2014-04-18 ENCOUNTER — Ambulatory Visit: Payer: Medicaid Other | Admitting: Internal Medicine

## 2014-12-09 ENCOUNTER — Emergency Department
Admission: EM | Admit: 2014-12-09 | Discharge: 2014-12-09 | Disposition: A | Discharge status: Home / Self Care | Payer: Medicaid Other | Attending: Emergency Medicine | Admitting: Emergency Medicine

## 2014-12-09 DIAGNOSIS — R21 Rash and other nonspecific skin eruption: Principal | ICD-10-CM | POA: Insufficient documentation

## 2014-12-09 DIAGNOSIS — L298 Other pruritus: Secondary | ICD-10-CM | POA: Insufficient documentation

## 2014-12-09 DIAGNOSIS — Y9289 Other specified places as the place of occurrence of the external cause: Secondary | ICD-10-CM | POA: Insufficient documentation

## 2014-12-09 DIAGNOSIS — Z720 Tobacco use: Secondary | ICD-10-CM | POA: Insufficient documentation

## 2014-12-09 DIAGNOSIS — W57XXXA Bitten or stung by nonvenomous insect and other nonvenomous arthropods, initial encounter: Secondary | ICD-10-CM | POA: Insufficient documentation

## 2014-12-09 DIAGNOSIS — I10 Essential (primary) hypertension: Secondary | ICD-10-CM | POA: Insufficient documentation

## 2014-12-09 DIAGNOSIS — B888 Other specified infestations: Secondary | ICD-10-CM | POA: Insufficient documentation

## 2014-12-09 LAB — HEMOGRAM, BLOOD
Hct: 41.9 % (ref 34.0–45.0)
Hgb: 14.2 gm/dL (ref 11.2–15.7)
MCH: 29.5 pg (ref 26.0–32.0)
MCHC: 33.9 % (ref 32.0–36.0)
MCV: 86.9 um3 (ref 79.0–95.0)
MPV: 9 fL — ABNORMAL LOW (ref 9.4–12.4)
Plt Count: 212 10*3/uL (ref 140–370)
RBC: 4.82 10*6/uL (ref 3.90–5.20)
RDW: 13.1 % (ref 12.0–14.0)
WBC: 7.4 10*3/uL (ref 4.0–10.0)

## 2014-12-09 LAB — PROTHROMBIN TIME, BLOOD
INR: 1
PT,Patient: 10.9 s (ref 9.7–12.5)

## 2014-12-09 MED ORDER — PERMETHRIN 5 % EX CREA
TOPICAL_CREAM | CUTANEOUS | 0 refills | Status: AC
Start: 2014-12-09 — End: ?

## 2014-12-09 MED ORDER — PERMETHRIN 5 % EX CREA
1.00 | TOPICAL_CREAM | Freq: Once | CUTANEOUS | Status: AC
Start: 2014-12-09 — End: 2014-12-09
  Administered 2014-12-09: 1 via TOPICAL
  Filled 2014-12-09: qty 60

## 2014-12-09 NOTE — ED Provider Notes (Signed)
Emergency Department Note  [facility] electronic medical record reviewed for pertinent medical history.     Nursing Triage Note :   Chief Complaint   Patient presents with    Derm Problem     c/o bed bug bites all over       HPI :   64 year old  female with a PMH significant for guillain barre, HTN who presents with rash. Hx of HCV. Pt reports 1 day of pruritic rash. Lives at [facility]. Others with similar rash. Reports remote bleeding gums. No blood in stool. No SOB. Hasn't tried anything for the itching. Reports they found the bed bugs at [facility].    Past Medical History :   Past Medical History   Diagnosis Date    Guillain-Barre 1970s     complicated by respiratory failure s/p tracheastomy x 1 month    Hypertension      not on meds       Past Surgical history :   Past Surgical History   Procedure Laterality Date    Ceas      C section         Family History:  No DM, HTN, [place]    Social History:  +tob, -EtOH, +prior meth and heroin    Medications:      What To Do With Your Medications      START taking these medications       Add'l Info    permethrin 5 % cream   Commonly known as:  ELIMITE   Apply to entire body and wash off after 8 hours    Quantity:  60 g   Refills:  0         CONTINUE taking these medications       Add'l Info    hydrochlorothiazide 25 MG tablet   Commonly known as:  HYDRODIURIL   Take 1 tablet by mouth daily.    Quantity:  30 tablet   Refills:  0            Where to Get Your Medications      Information about where to get these medications is not yet available     ! Ask your nurse or doctor about these medications     permethrin 5 % cream             Allergies: Review of patient's allergies indicates no known allergies.    Review of Systems:   Const: No fevers, chills  HEENT: No rhinnorhea, no ocular discharge  Pulm: No cough, SOB  Card: No CP, palpitations  MSK: No LE edema, no weakness  Skin: + rash  Neuro: No numbness, dizziness, blurred vision.   GI: No abdominal pain, nausea, vomiting, diarrhea,  constipation, hematochezia, melena  GU: No dysuria, hematuria  ENDO: No heat/cold intolerance      Physical Exam:   12/09/14  1210 12/09/14  1627   BP: 145/81 140/78   Pulse: 88 88   Resp: 16 15   Temp: 97.8 F (36.6 C)    SpO2: 98% 98%     Nursing note and vitals reviewed. Pt not hypoxic.  Constitutional: The patient is oriented to person, place, and time and appears well-developed and well-nourished. No distress.   Eyes: Normal extra-ocular movements. Pupils are equal, round, and reactive to light. No conjunctival injection.   Cardiovascular: Regular rhythm, normal heart sounds. Exam reveals no gallop and no friction rub. No murmur heard. 2+ radial pulses b/l.  Pulmonary/Chest:  Effort normal and breath sounds normal. No respiratory distress. No wheezes, rhonchi, or rales . No chest wall tenderness.   Abdominal: Soft. No distension and no masses. There is no tenderness.   Neuro: No evidence of facial droop, normal speech, mentation appropriate.   Psychiatric: Normal affect. Mood not labile nor depressed.   Skin: LE, L side of neck with diffuse excoriations. Several larger macular lesion. Non blanching. No petechia.    Extr: No LE edema.    ED Course & Clinical Decision Making:  64 year old  female presents with rash.    Likely bed bugs. Given permethrin. Advised to wash clothes with hot water. INR and CBC given concern for bleeding. Hgb, platelets, inr stable. D/c in stable condition.    F/u with PCP this week.   Return precautions reviewed.    I have discussed my thought process and plan for the patient with the attending physician.    Wilnette Kales, MD   [facility] Resident               Lebron Conners Arrie Senate, MD  Resident  12/09/14 1952       Corine Shelter, MD  12/09/14 2029

## 2014-12-09 NOTE — ED Notes (Signed)
Pt cleared for discharge by MD; dc instructions reviewed and pt's questions answered. Pt verbalized understanding and ambulated with steady even gait out of ED in NAD.

## 2014-12-09 NOTE — ED Notes (Signed)
Labs obtained via butterfly straight stick using aseptic technique from RHAND x1 attempt without difficulty. Gauze and pressure applied to site, bandage applied, bleeding controlled. Labs labeled and sent after verifying pt name and dob. Pt tolerated well.

## 2014-12-09 NOTE — Discharge Instructions (Signed)
Bed Bugs    You may have been exposed to bed bugs.    Bed bugs are small insects that eat the blood of humans and other animals. Bed bugs live in furniture or clothing and may be carried from one place to another. They are nocturnal, meaning they are active mostly at night. Because bed bugs are most active at night, most people are bitten while sleeping on a mattress or bedding that has been infected with bed bugs.    Bed bug bites are most common on the legs. The bites may be raised and red, and they may cause itching or swelling. They can last longer than mosquito or other insect bites. Some people are allergic to bed bugs. Bed bug allergies are very rare, but they can make the reaction more severe. Bed bugs have never been proved to carry other infections.    Many people think bed bugs live only in unclean places. This is not true. Bed bugs may be found anywhere, although good housekeeping may help to prevent or reduce bed bugs.    Bed bugs look like small seeds. It is hard to tell whether they are present. Some people use flashlights to see bed bugs at night. Some people catch bed bugs by leaving glue traps or double-sided tape beside the bed. For many people, finding the typical bites of a bed bug is enough to confirm that they have bed bugs.    It can be hard to get rid of bed bugs. Pesticides (chemicals that kill insects) may help, but not always. Vacuuming and proper washing of bedding may also help to control bed bugs.    The only treatment for bed bug bites is to help with symptoms, like swelling and itching. Try the following:   Avoid scratching the bites.   Use an oral (by mouth) antihistamine, like diphenhydramine (Benadryl).   Use an anti-itch cream, like hydrocortisone.    YOU SHOULD SEEK MEDICAL ATTENTION IMMEDIATELY, EITHER HERE OR AT THE NEAREST EMERGENCY DEPARTMENT, IF ANY OF THE FOLLOWING OCCURS:   You have trouble breathing or notice swelling on your face, lips or tongue. These are  symptoms of a severe allergic reaction.   You develop signs of infection. These can include fever (temperature higher than 100.81F / 38C), vomiting, or increased redness, pain, or swelling near the bite.   Your symptoms seem worse.   You have any other concerns.        Follow up with PCP  Use permethrin cream  Return to the ED for rash, itching, nausea, vomiting, bleeding or other concerns.

## 2014-12-22 NOTE — ED Follow-up Note (Signed)
Follow-up type: Callback       Routine ED Patient Call Back    Patient unable to be contacted, no message left
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
## 2017-02-01 DEATH — deceased
